# Patient Record
Sex: Female | Born: 1950 | Race: White | Hispanic: No | Marital: Married | Smoking: Never smoker
Health system: Southern US, Community
[De-identification: ages and names within clinical notes are randomized; demographics above are authoritative.]

## PROBLEM LIST (undated history)

## (undated) DIAGNOSIS — R5383 Other fatigue: Secondary | ICD-10-CM

## (undated) DIAGNOSIS — G4700 Insomnia, unspecified: Secondary | ICD-10-CM

## (undated) DIAGNOSIS — E559 Vitamin D deficiency, unspecified: Secondary | ICD-10-CM

## (undated) DIAGNOSIS — K200 Eosinophilic esophagitis: Secondary | ICD-10-CM

## (undated) DIAGNOSIS — Z9889 Other specified postprocedural states: Secondary | ICD-10-CM

## (undated) DIAGNOSIS — C801 Malignant (primary) neoplasm, unspecified: Secondary | ICD-10-CM

## (undated) DIAGNOSIS — K635 Polyp of colon: Secondary | ICD-10-CM

## (undated) DIAGNOSIS — K900 Celiac disease: Secondary | ICD-10-CM

## (undated) DIAGNOSIS — F329 Major depressive disorder, single episode, unspecified: Secondary | ICD-10-CM

## (undated) DIAGNOSIS — F32A Depression, unspecified: Secondary | ICD-10-CM

## (undated) DIAGNOSIS — E785 Hyperlipidemia, unspecified: Secondary | ICD-10-CM

## (undated) DIAGNOSIS — F419 Anxiety disorder, unspecified: Secondary | ICD-10-CM

## (undated) DIAGNOSIS — Z8719 Personal history of other diseases of the digestive system: Secondary | ICD-10-CM

## (undated) DIAGNOSIS — Z85828 Personal history of other malignant neoplasm of skin: Secondary | ICD-10-CM

## (undated) DIAGNOSIS — K589 Irritable bowel syndrome without diarrhea: Secondary | ICD-10-CM

## (undated) DIAGNOSIS — K219 Gastro-esophageal reflux disease without esophagitis: Secondary | ICD-10-CM

## (undated) DIAGNOSIS — E039 Hypothyroidism, unspecified: Secondary | ICD-10-CM

## (undated) HISTORY — DX: Depression, unspecified: F32.A

## (undated) HISTORY — DX: Vitamin D deficiency, unspecified: E55.9

## (undated) HISTORY — PX: TONSILLECTOMY: SUR1361

## (undated) HISTORY — DX: Insomnia, unspecified: G47.00

## (undated) HISTORY — DX: Other fatigue: R53.83

## (undated) HISTORY — DX: Gastro-esophageal reflux disease without esophagitis: K21.9

## (undated) HISTORY — DX: Hyperlipidemia, unspecified: E78.5

## (undated) HISTORY — PX: PARATHYROIDECTOMY: SHX19

## (undated) HISTORY — DX: Irritable bowel syndrome, unspecified: K58.9

## (undated) HISTORY — DX: Celiac disease: K90.0

## (undated) HISTORY — DX: Anxiety disorder, unspecified: F41.9

## (undated) HISTORY — DX: Eosinophilic esophagitis: K20.0

## (undated) HISTORY — DX: Polyp of colon: K63.5

## (undated) HISTORY — DX: Personal history of other malignant neoplasm of skin: Z85.828

## (undated) HISTORY — DX: Other specified postprocedural states: Z98.890

## (undated) HISTORY — DX: Major depressive disorder, single episode, unspecified: F32.9

## (undated) HISTORY — DX: Personal history of other diseases of the digestive system: Z87.19

## (undated) HISTORY — DX: Hypothyroidism, unspecified: E03.9

## (undated) HISTORY — DX: Malignant (primary) neoplasm, unspecified: C80.1

---

## 1998-08-02 ENCOUNTER — Other Ambulatory Visit: Admission: RE | Admit: 1998-08-02 | Discharge: 1998-08-02 | Payer: Self-pay | Admitting: Obstetrics and Gynecology

## 1998-10-19 ENCOUNTER — Other Ambulatory Visit: Admission: RE | Admit: 1998-10-19 | Discharge: 1998-10-19 | Payer: Self-pay | Admitting: *Deleted

## 1998-11-05 HISTORY — PX: CARPAL TUNNEL RELEASE: SHX101

## 1999-06-23 ENCOUNTER — Other Ambulatory Visit: Admission: RE | Admit: 1999-06-23 | Discharge: 1999-06-23 | Payer: Self-pay | Admitting: Obstetrics and Gynecology

## 2000-01-17 ENCOUNTER — Other Ambulatory Visit: Admission: RE | Admit: 2000-01-17 | Discharge: 2000-01-17 | Payer: Self-pay | Admitting: Obstetrics and Gynecology

## 2000-08-06 ENCOUNTER — Encounter: Admission: RE | Admit: 2000-08-06 | Discharge: 2000-08-06 | Payer: Self-pay | Admitting: *Deleted

## 2000-08-21 ENCOUNTER — Other Ambulatory Visit: Admission: RE | Admit: 2000-08-21 | Discharge: 2000-08-21 | Payer: Self-pay | Admitting: *Deleted

## 2001-06-10 ENCOUNTER — Encounter: Admission: RE | Admit: 2001-06-10 | Discharge: 2001-06-10 | Payer: Self-pay | Admitting: *Deleted

## 2001-06-26 ENCOUNTER — Other Ambulatory Visit: Admission: RE | Admit: 2001-06-26 | Discharge: 2001-06-26 | Payer: Self-pay | Admitting: *Deleted

## 2001-06-26 ENCOUNTER — Encounter: Admission: RE | Admit: 2001-06-26 | Discharge: 2001-06-26 | Payer: Self-pay | Admitting: *Deleted

## 2002-04-03 ENCOUNTER — Encounter: Payer: Self-pay | Admitting: Internal Medicine

## 2002-04-03 ENCOUNTER — Ambulatory Visit (HOSPITAL_COMMUNITY): Admission: RE | Admit: 2002-04-03 | Discharge: 2002-04-03 | Payer: Self-pay | Admitting: Internal Medicine

## 2002-06-15 ENCOUNTER — Encounter: Payer: Self-pay | Admitting: *Deleted

## 2002-06-15 ENCOUNTER — Encounter: Admission: RE | Admit: 2002-06-15 | Discharge: 2002-06-15 | Payer: Self-pay | Admitting: *Deleted

## 2002-06-15 ENCOUNTER — Other Ambulatory Visit: Admission: RE | Admit: 2002-06-15 | Discharge: 2002-06-15 | Payer: Self-pay | Admitting: *Deleted

## 2003-06-23 ENCOUNTER — Encounter: Admission: RE | Admit: 2003-06-23 | Discharge: 2003-06-23 | Payer: Self-pay | Admitting: *Deleted

## 2003-06-23 ENCOUNTER — Encounter: Payer: Self-pay | Admitting: *Deleted

## 2003-06-23 ENCOUNTER — Other Ambulatory Visit: Admission: RE | Admit: 2003-06-23 | Discharge: 2003-06-23 | Payer: Self-pay | Admitting: *Deleted

## 2003-09-10 ENCOUNTER — Ambulatory Visit (HOSPITAL_COMMUNITY): Admission: RE | Admit: 2003-09-10 | Discharge: 2003-09-10 | Payer: Self-pay | Admitting: Gastroenterology

## 2004-06-13 ENCOUNTER — Encounter: Admission: RE | Admit: 2004-06-13 | Discharge: 2004-06-13 | Payer: Self-pay | Admitting: Internal Medicine

## 2004-08-29 ENCOUNTER — Ambulatory Visit (HOSPITAL_COMMUNITY): Admission: RE | Admit: 2004-08-29 | Discharge: 2004-08-29 | Payer: Self-pay | Admitting: Orthopedic Surgery

## 2004-08-29 ENCOUNTER — Ambulatory Visit (HOSPITAL_BASED_OUTPATIENT_CLINIC_OR_DEPARTMENT_OTHER): Admission: RE | Admit: 2004-08-29 | Discharge: 2004-08-29 | Payer: Self-pay | Admitting: Orthopedic Surgery

## 2005-06-14 ENCOUNTER — Encounter: Admission: RE | Admit: 2005-06-14 | Discharge: 2005-06-14 | Payer: Self-pay | Admitting: Obstetrics and Gynecology

## 2005-07-17 ENCOUNTER — Other Ambulatory Visit: Admission: RE | Admit: 2005-07-17 | Discharge: 2005-07-17 | Payer: Self-pay | Admitting: Obstetrics and Gynecology

## 2007-08-06 HISTORY — PX: UPPER GI ENDOSCOPY: SHX6162

## 2007-09-11 ENCOUNTER — Encounter: Admission: RE | Admit: 2007-09-11 | Discharge: 2007-09-11 | Payer: Self-pay | Admitting: Internal Medicine

## 2008-09-09 ENCOUNTER — Encounter: Admission: RE | Admit: 2008-09-09 | Discharge: 2008-09-09 | Payer: Self-pay | Admitting: Internal Medicine

## 2009-10-05 ENCOUNTER — Encounter: Admission: RE | Admit: 2009-10-05 | Discharge: 2009-10-05 | Payer: Self-pay | Admitting: Internal Medicine

## 2010-10-12 ENCOUNTER — Encounter
Admission: RE | Admit: 2010-10-12 | Discharge: 2010-10-12 | Payer: Self-pay | Source: Home / Self Care | Admitting: Internal Medicine

## 2010-11-25 ENCOUNTER — Encounter: Payer: Self-pay | Admitting: Gastroenterology

## 2011-03-23 NOTE — Op Note (Signed)
NAME:  Kerry Lewis, Kerry Lewis                ACCOUNT NO.:  [id number]   MEDICAL RECORD NO.:  [id number]          PATIENT TYPE:  AMB   LOCATION:  [place]                          FACILITY:  [facility]   PHYSICIAN:  Youlanda Mighty. Sypher Brooke Bonito., M.D.DATE OF BIRTH:  11-May-1951   DATE OF PROCEDURE:  08/29/2004  DATE OF DISCHARGE:                                 OPERATIVE REPORT   PREOPERATIVE DIAGNOSIS:  Entrapment neuropathy, median nerve, right carpal  tunnel.   POSTOPERATIVE DIAGNOSIS:  Entrapment neuropathy, median nerve, right carpal  tunnel.   OPERATION:  Release of right transverse carpal ligament.   OPERATING SURGEON:  Youlanda Mighty. Sypher, M.D.   ASSISTANT:  Julian Reil, P.A.   ANESTHESIA:  General by LMA.   SUPERVISING ANESTHESIOLOGIST:  Leda Quail, M.D.   INDICATIONS:  Kerry Lewis is a 60 year old woman who was referred by Dr.  Teressa Lower in [place], [place] for evaluation and management of  hand discomfort and numbness on the right.  Clinical examination suggested  entrapment neuropathy of the median nerve at the wrist level consistent with  carpal tunnel syndrome.   Due to a failure to respond to nonoperative measures, she is brought to the  operating room at this time for release of her right transverse carpal  ligament.   PROCEDURE:  Kerry Lewis was brought to the operating room and placed in  supine position on the operating table.  Following induction of general  anesthesia by LMA technique, the right arm was prepped with Betadine soaping  solution and sterilely draped.   Following exsanguination of the right arm and hand with an Esmarch bandage,  the arterial tourniquet on the proximal brachium was inflated to 220 mmHg.  Procedure commenced with a short incision in the line of the ring finger and  palm.  Subcutaneous tissues were carefully divided, revealing the palmar  fascia.  This was split longitudinally to reveal the common extensor  branches of the median nerve.   These were followed back to the transverse  carpal ligament proper, which was gently isolated from the median nerve.  The ligament was then released with scissors subcutaneously, extending into  the distal forearm.   This widely opened the carpal canal.  No masses or other predicaments were  noted.  Bleeding points along the margin of the released ligament were  electrocauterized with bipolar current, followed by repair of the skin with  intradermal 3-0 Prolene suture.   A compressive dressing was applied with a volar plaster splint to maintain  the wrist in 5 degrees of dorsiflexion.   For aftercare, Kerry Lewis is advised to elevate her hand for 1 week.  She  will work on range of motion exercises.  She was given a prescription for  Percocet 5 mg 1 or 2 tablets p.o. q.4-6 h. p.r.n. pain, 20 tablets without  refill.      [facility]   RVS/MEDQ  D:  08/29/2004  T:  08/29/2004  Job:  [id number]   cc:   Teressa Lower  [address]  [place] [address]  Fax: [phone]

## 2012-04-14 ENCOUNTER — Other Ambulatory Visit: Payer: Self-pay | Admitting: Internal Medicine

## 2012-04-14 DIAGNOSIS — M8580 Other specified disorders of bone density and structure, unspecified site: Secondary | ICD-10-CM

## 2012-04-28 ENCOUNTER — Ambulatory Visit
Admission: RE | Admit: 2012-04-28 | Discharge: 2012-04-28 | Disposition: A | Discharge status: Home / Self Care | Payer: Self-pay | Source: Ambulatory Visit | Attending: Internal Medicine | Admitting: Internal Medicine

## 2012-04-28 DIAGNOSIS — M8580 Other specified disorders of bone density and structure, unspecified site: Secondary | ICD-10-CM

## 2013-07-02 HISTORY — PX: COLONOSCOPY: SHX174

## 2013-11-23 ENCOUNTER — Other Ambulatory Visit: Payer: Self-pay

## 2013-11-23 DIAGNOSIS — Z1231 Encounter for screening mammogram for malignant neoplasm of breast: Secondary | ICD-10-CM

## 2013-12-09 ENCOUNTER — Ambulatory Visit: Payer: Self-pay

## 2013-12-09 ENCOUNTER — Ambulatory Visit
Admission: RE | Admit: 2013-12-09 | Discharge: 2013-12-09 | Disposition: A | Discharge status: Home / Self Care | Payer: PRIVATE HEALTH INSURANCE | Source: Ambulatory Visit

## 2013-12-09 DIAGNOSIS — Z1231 Encounter for screening mammogram for malignant neoplasm of breast: Secondary | ICD-10-CM

## 2013-12-28 ENCOUNTER — Ambulatory Visit (INDEPENDENT_AMBULATORY_CARE_PROVIDER_SITE_OTHER): Payer: PRIVATE HEALTH INSURANCE | Admitting: Gynecology

## 2013-12-28 ENCOUNTER — Telehealth: Payer: Self-pay | Admitting: *Deleted

## 2013-12-28 ENCOUNTER — Other Ambulatory Visit (HOSPITAL_COMMUNITY)
Admission: RE | Admit: 2013-12-28 | Discharge: 2013-12-28 | Disposition: A | Discharge status: Home / Self Care | Payer: PRIVATE HEALTH INSURANCE | Source: Ambulatory Visit | Attending: Gynecology | Admitting: Gynecology

## 2013-12-28 ENCOUNTER — Encounter: Payer: Self-pay | Admitting: Gynecology

## 2013-12-28 VITALS — BP 130/82 | Ht 63.5 in | Wt 128.4 lb

## 2013-12-28 DIAGNOSIS — M8580 Other specified disorders of bone density and structure, unspecified site: Secondary | ICD-10-CM

## 2013-12-28 DIAGNOSIS — M899 Disorder of bone, unspecified: Secondary | ICD-10-CM

## 2013-12-28 DIAGNOSIS — N951 Menopausal and female climacteric states: Secondary | ICD-10-CM

## 2013-12-28 DIAGNOSIS — K900 Celiac disease: Secondary | ICD-10-CM | POA: Insufficient documentation

## 2013-12-28 DIAGNOSIS — Z780 Asymptomatic menopausal state: Secondary | ICD-10-CM

## 2013-12-28 DIAGNOSIS — N952 Postmenopausal atrophic vaginitis: Secondary | ICD-10-CM

## 2013-12-28 DIAGNOSIS — M949 Disorder of cartilage, unspecified: Secondary | ICD-10-CM

## 2013-12-28 DIAGNOSIS — N950 Postmenopausal bleeding: Secondary | ICD-10-CM

## 2013-12-28 DIAGNOSIS — Z01419 Encounter for gynecological examination (general) (routine) without abnormal findings: Secondary | ICD-10-CM | POA: Insufficient documentation

## 2013-12-28 DIAGNOSIS — Z1151 Encounter for screening for human papillomavirus (HPV): Secondary | ICD-10-CM | POA: Insufficient documentation

## 2013-12-28 HISTORY — DX: Other specified disorders of bone density and structure, unspecified site: M85.80

## 2013-12-28 HISTORY — DX: Postmenopausal atrophic vaginitis: N95.2

## 2013-12-28 HISTORY — DX: Celiac disease: K90.0

## 2013-12-28 MED ORDER — OSPEMIFENE 60 MG PO TABS: 60.0000 mg | ORAL_TABLET | Freq: Every day | ORAL | Status: DC

## 2013-12-28 NOTE — Telephone Encounter (Signed)
Yes, please tell her that I inadvertently picked up the sheet of paper with the instructions that I wanted her to pursue. Tell her not to continue with the estrogen patch after the end of this week. Her progesterone tablet take 100 mg once a day 1 Wednesday and 1 Friday and then discontinue it. She should stop testosterone and estrogen cream. She could start the Hedrick today.

## 2013-12-28 NOTE — Patient Instructions (Addendum)
Endometrial Biopsy Endometrial biopsy is a procedure in which a tissue sample is taken from inside the uterus. The tissue sample is then looked at under a microscope to see if the tissue is normal or abnormal. The endometrium is the lining of the uterus. This procedure helps determine where you are in your menstrual cycle and how hormone levels are affecting the lining of the uterus. This procedure may also be used to evaluate uterine bleeding or to diagnose endometrial cancer, tuberculosis, polyps, or inflammatory conditions.  LET [facility] CARE PROVIDER KNOW ABOUT:  Any allergies you have.  All medicines you are taking, including vitamins, herbs, eye drops, creams, and over-the-counter medicines.  Previous problems you or members of your family have had with the use of anesthetics.  Any blood disorders you have.  Previous surgeries you have had.  Medical conditions you have.  Possibility of pregnancy. RISKS AND COMPLICATIONS Generally, this is a safe procedure. However, as with any procedure, complications can occur. Possible complications include:  Bleeding.  Pelvic infection.  Puncture of the uterine wall with the biopsy device (rare). BEFORE THE PROCEDURE   Keep a record of your menstrual cycles as directed by your health care provider. You may need to schedule your procedure for a specific time in your cycle.  You may want to bring a sanitary pad to wear home after the procedure.  Arrange for someone to drive you home after the procedure if you will be given a medicine to help you relax (sedative). PROCEDURE   You may be given a sedative to relax you.  You will lie on an exam table with your feet and legs supported as in a pelvic exam.  Your health care provider will insert an instrument (speculum) into your vagina to see your cervix.  Your cervix will be cleansed with an antiseptic solution. A medicine (local anesthetic) will be used to numb the cervix.  A forceps  instrument (tenaculum) will be used to hold your cervix steady for the biopsy.  A thin, rodlike instrument (uterine sound) will be inserted through your cervix to determine the length of your uterus and the location where the biopsy sample will be removed.  A thin, flexible tube (catheter) will be inserted through your cervix and into the uterus. The catheter is used to collect the biopsy sample from your endometrial tissue.  The catheter and speculum will then be removed, and the tissue sample will be sent to a lab for examination. AFTER THE PROCEDURE  You will rest in a recovery area until you are ready to go home.  You may have mild cramping and a small amount of vaginal bleeding for a few days after the procedure. This is normal.  Make sure you find out how to get your test results. Document Released: 02/22/2005 Document Revised: 06/24/2013 Document Reviewed: 04/08/2013 [facility] Patient Information 2014 [place], [place]. Transvaginal Ultrasound Transvaginal ultrasound is a pelvic ultrasound, using a metal probe that is placed in the vagina, to look at a women's female organs. Transvaginal ultrasound is a method of seeing inside the pelvis of a woman. The ultrasound machine sends out sound waves from the transducer (probe). These sound waves bounce off body structures (like an echo) to create a picture. The picture shows up on a monitor. It is called transvaginal because the probe is inserted into the vagina. There should be very little discomfort from the vaginal probe. This test can also be used during pregnancy. Endovaginal ultrasound is another name for a  transvaginal ultrasound. In a transabdominal ultrasound, the probe is placed on the outside of the belly. This method gives pictures that are lower quality than pictures from the transvaginal technique. Transvaginal ultrasound is used to look for problems of the female genital tract. Some such problems include:  Infertility  problems.  Congenital (birth defect) malformations of the uterus and ovaries.  Tumors in the uterus.  Abnormal bleeding.  Ovarian tumors and cysts.  Abscess (inflamed tissue around pus) in the pelvis.  Unexplained abdominal or pelvic pain.  Pelvic infection. DURING PREGNANCY, TRANSVAGINAL ULTRASOUND MAY BE USED TO LOOK AT:  Normal pregnancy.  Ectopic pregnancy (pregnancy outside the uterus).  Fetal heartbeat.  Abnormalities in the pelvis, that are not seen well with transabdominal ultrasound.  Suspected twins or multiples.  Impending miscarriage.  Problems with the cervix (incompetent cervix, not able to stay closed and hold the baby).  When doing an amniocentesis (removing fluid from the pregnancy sac, for testing).  Looking for abnormalities of the baby.  Checking the growth, development, and age of the fetus.  Measuring the amount of fluid in the amniotic sac.  When doing an external version of the baby (moving baby into correct position).  Evaluating the baby for problems in high risk pregnancies (biophysical profile).  Suspected fetal demise (death). Sometimes a special ultrasound method called Saline Infusion Sonography (SIS) is used for a more accurate look at the uterus. Sterile saline (salt water) is injected into the uterus of non-pregnant patients to see the inside of the uterus better. SIS is not used on pregnant women. The vaginal probe can also assist in obtaining biopsies of abnormal areas, in draining fluid from cysts on the ovary, and in finding IUDs (intrauterine device, birth control) that cannot be located. PREPARATION FOR TEST A transvaginal ultrasound is done with the bladder empty. The transabdominal ultrasound is done with your bladder full. You may be asked to drink several glasses of water before that exam. Sometimes, a transabdominal ultrasound is done just after a transvaginal ultrasound, to look at organs in your abdomen. PROCEDURE  You  will lie down on a table, with your knees bent and your feet in foot holders. The probe is covered with a condom. A sterile lubricant is put into the vagina and on the probe. The lubricant helps transmit the sound waves and avoid irritating the vagina. Your caregiver will move the probe inside the vaginal cavity to scan the pelvic structures. A normal test will show a normal pelvis and normal contents. An abnormal test will show abnormalities of the pelvis, placenta, or baby. ABNORMAL RESULTS MAY BE DUE TO:  Growths or tumors in the:  Uterus.  Ovaries.  Vagina.  Other pelvic structures.  Non-cancerous growths of the uterus and ovaries.  Twisting of the ovary, cutting off blood supply to the ovary (ovarian torsion).  Areas of infection, including:  Pelvic inflammatory disease.  Abscess in the pelvis.  Locating an IUD. PROBLEMS FOUND IN PREGNANT WOMEN MAY INCLUDE:  Ectopic pregnancy (pregnancy outside the uterus).  Multiple pregnancies.  Early dilation (opening) of the cervix. This may indicate an incompetent cervix and early delivery.  Impending miscarriage.  Fetal death.  Problems with the placenta, including:  Placenta has grown over the opening of the womb (placenta previa).  Placenta has separated early in the womb (placental abruption).  Placenta grows into the muscle of the uterus (placenta accreta).  Tumors of pregnancy, including gestational trophoblastic disease. This is an abnormal pregnancy, with no fetus. The  uterus is filled with many grape-like cysts that could sometimes be cancerous.  Incorrect position of the fetus (breech, vertex).  Intrauterine fetal growth retardation (IUGR) (poor growth in the womb).  Fetal abnormalities or infection. RISKS AND COMPLICATIONS There are no known risks to the ultrasound procedure. There is no X-ray used when doing an ultrasound. Document Released: 10/03/2004 Document Revised: 01/14/2012 Document Reviewed:  09/21/2009 [facility] Patient Information 2014 [place], [place]. Ospemifene oral tablets What is this medicine? OSPEMIFENE (os PEM i feen) is used to treat painful sexual intercourse in females, a symptom of changes in and around the vagina during menopause. This medicine may be used for other purposes; ask your health care provider or pharmacist if you have questions. COMMON BRAND NAME(S): Osphena What should I tell my health care provider before I take this medicine? They need to know if you have any of these conditions: -cancer, such as breast, uterine, or other cancer -heart disease -history of blood clots -history of stroke -history of vaginal bleeding -liver disease -premenopausal -smoke tobacco -an unusual or allergic reaction to ospemifene, other medicines, foods, dyes, or preservatives -pregnant or trying to get pregnant -breast-feeding How should I use this medicine? Take this medicine by mouth with a glass of water. Take this medicine with food. Follow the directions on the prescription label. Do not take your medicine more often than directed. Talk to your pediatrician regarding the use of this medicine in children. Special care may be needed. Overdosage: If you think you've taken too much of this medicine contact a poison control center or emergency room at once. Overdosage: If you think you have taken too much of this medicine contact a poison control center or emergency room at once. NOTE: This medicine is only for you. Do not share this medicine with others. What if I miss a dose? If you miss a dose, take it as soon as you can. If it is almost time for your next dose, take only that dose. Do not take double or extra doses. What may interact with this medicine? -doxycycline -estrogens -fluconazole -furosemide -glyburide -ketoconazole -phenytoin -rifampin -warfarin This list may not describe all possible interactions. Give your health care provider a list of all the  medicines, herbs, non-prescription drugs, or dietary supplements you use. Also tell them if you smoke, drink alcohol, or use illegal drugs. Some items may interact with your medicine. What should I watch for while using this medicine? Visit your health care professional for regular checks on your progress. You will need a regular breast and pelvic exam and Pap smear while on this medicine. You should also discuss the need for regular mammograms with your health care professional, and follow his or her guidelines for these tests. Also, periodically discuss the need to continue taking this medicine. Taking this medicine for long periods of time may increase your risk for serious side effects. This medicine can increase the risk of developing a condition (endometrial hyperplasia) that may lead to cancer of the lining of the uterus. Taking progestins, another hormone drug, with this medicine lowers the risk of developing this condition. Therefore, if your uterus has not been removed (by a hysterectomy), your doctor may prescribe a progestin for you to take together with your estrogen. You should know, however, that taking estrogens with progestins may have additional health risks. You should discuss the use of estrogens and progestins with your health care professional to determine the benefits and risks for you. This medicine can rarely cause blood clots.  You should avoid long periods of bed rest while taking this medicine. If you are going to have surgery, tell your doctor or health care professional that you are taking this medicine. This medicine should be stopped at least 4-6 weeks before surgery. After surgery, it should be restarted only after you are walking again. It should not be restarted while you still need long periods of bed rest. You should not smoke while taking this medicine. Smoking may also increase your risk of blood clots. Smoking can also decrease the effects of this medicine. This medicine  does not prevent hot flashes. It may cause hot flashes in some patients. If you have any reason to think you are pregnant; stop taking this medicine at once and contact your doctor or health care professional. What side effects may I notice from receiving this medicine? Side effects that you should report to your doctor or health care professional as soon as possible: -breathing problems -changes in vision -confusion, trouble speaking or understanding -new breast lumps -pain, swelling, warmth in the leg -pelvic pain or pressure -severe headaches -sudden chest pain -sudden numbness or weakness of the face, arm or leg -trouble walking, dizziness, loss of balance or coordination -unusual vaginal bleeding patterns -vaginal discharge that is bloody or brown  Side effects that usually do not require medical attention (Report these to your doctor or health care professional if they continue or are bothersome.): -hot flushes or flashes -increased sweating -muscle cramps -vaginal discharge (white or clear) This list may not describe all possible side effects. Call your doctor for medical advice about side effects. You may report side effects to FDA at 1-800-FDA-1088. Where should I keep my medicine? Keep out of the reach of children. Store at room temperature between 20 and 25 degrees C (68 and 77 degrees F). Protect from light. Keep container tightly closed. Throw away any unused medicine after the expiration date. NOTE: This sheet is a summary. It may not cover all possible information. If you have questions about this medicine, talk to your doctor, pharmacist, or health care provider.  2014, [employer]vier/[employer] Standard. (2012-01-03 10:29:05)

## 2013-12-28 NOTE — Telephone Encounter (Signed)
Pt was seen today with direction on how to wean off HRT, but pt never received the written directions on how to do this. Per note patient is taking:  Estrogen patch 0.375 q. weekly  Progesterone 300 mg daily  Testosterone Propionate cream which she applied daily  Estrace vaginal cream each bedtime  Please advise how to wean off.

## 2013-12-28 NOTE — Progress Notes (Signed)
Kerry Lewis 1951-09-13 [id number]   History:    63 y.o.  for annual gyn exam patient to the practice who is being followed by her family doctor in [place], [place] A. Dr. Delena Bali whose been doing her blood work. She has not had a Pap smear in 3 years. Patient states that she has normal Pap smears in the past. Patient suffers from celiac disease and has had history of vitamin D deficiency and is on 5000 units daily. Her PCP recently did all her lab work to include vitamin D level which was normal. Patient had gone to a holistic medical group practice who had started her on estrogen progesterone combination as a result of patient's vaginal atrophy. Patient states that she has not been able to have intercourse for 10 years. She's currently on the following regimen:  Estrogen patch 0.375 q. weekly Progesterone 300 mg daily Testosterone Propionate  cream which she applied daily Estrace vaginal cream each bedtime  Patient stated that in January of this year she had bleeding for one week and had not had any bleeding since her menopause 10 years ago.  Patient had a colonoscopy 2014 which was normal. Her mammogram was done this year reported to be normal. Last bone density study done in 2013 with the lowest T score of -1.8 left femoral neck. Patient had been on reclast  for 2 years only. Patient scheduled for bone density study Later this year.    Past medical history,surgical history, family history and social history were all reviewed and documented in the EPIC chart.  Gynecologic History No LMP recorded. Patient is postmenopausal. Contraception: post menopausal status Last Pap: 3 years ago. Results were: normal Last mammogram: 2015. Results were: normal  Obstetric History OB History  Gravida Para Term Preterm AB SAB TAB Ectopic Multiple Living  3 2   1 1    2     # Outcome Date GA Lbr Len/2nd Weight Sex Delivery Anes PTL Lv  3 SAB           2 PAR           1 PAR                 ROS: A ROS was performed and pertinent positives and negatives are included in the history.  GENERAL: No fevers or chills. HEENT: No change in vision, no earache, sore throat or sinus congestion. NECK: No pain or stiffness. CARDIOVASCULAR: No chest pain or pressure. No palpitations. PULMONARY: No shortness of breath, cough or wheeze. GASTROINTESTINAL: No abdominal pain, nausea, vomiting or diarrhea, melena or bright red blood per rectum. GENITOURINARY: No urinary frequency, urgency, hesitancy or dysuria. MUSCULOSKELETAL: No joint or muscle pain, no back pain, no recent trauma. DERMATOLOGIC: No rash, no itching, no lesions. ENDOCRINE: No polyuria, polydipsia, no heat or cold intolerance. No recent change in weight. HEMATOLOGICAL: No anemia or easy bruising or bleeding. NEUROLOGIC: No headache, seizures, numbness, tingling or weakness. PSYCHIATRIC: No depression, no loss of interest in normal activity or change in sleep pattern.     Exam: chaperone present  BP 130/82  Ht 5' 3.5" (1.613 m)  Wt 128 lb 6.4 oz (58.242 kg)  BMI 22.39 kg/m2  Body mass index is 22.39 kg/(m^2).  General appearance : Well developed well nourished female. No acute distress HEENT: Neck supple, trachea midline, no carotid bruits, no thyroidmegaly Lungs: Clear to auscultation, no rhonchi or wheezes, or rib retractions  Heart: Regular rate and rhythm, no murmurs  or gallops Breast:Examined in sitting and supine position were symmetrical in appearance, no palpable masses or tenderness,  no skin retraction, no nipple inversion, no nipple discharge, no skin discoloration, no axillary or supraclavicular lymphadenopathy Abdomen: no palpable masses or tenderness, no rebound or guarding Extremities: no edema or skin discoloration or tenderness  Pelvic:  Bartholin, Urethra, Skene Glands: Within normal limits             Vagina: No gross lesions or discharge  Cervix: No gross lesions or discharge  Uterus  anteverted, normal  size, shape and consistency, non-tender and mobile  Adnexa  Without masses or tenderness  Anus and perineum  normal   Rectovaginal  normal sphincter tone without palpated masses or tenderness             Hemoccult cards provided     Assessment/Plan:  63 y.o. female for annual exam with postmenopausal bleeding. I've raising the concerns of the amount of estrogen and daily progesterone and topical testosterone which could put her at increased risk for breast cancer. Her main issue has been vaginal atrophy and dyspareunia. We discussed alternative treatment with Osphena 60 mg daily by mouth and to discontinue all her entire regimen described above. The risks benefits and pros and cons of the medication were discussed. Because of her postmenopausal bleeding and endometrial biopsy was done in a sterile fashion with a Pipelle with minimal tissue obtained and submitted for histological evaluation. Patient will return back to the office next week for a sonohysterogram to rule out any intracavitary defect. Her Pap smear was done today and the new guidelines were discussed. Hemoccult cards were provided to the patient. Patient states her immunizations are up-to-date. We will await the results of the bone density study from this year.  Note: This dictation was prepared with  Dragon/digital dictation along withSmart phrase technology. Any transcriptional errors that result from this process are unintentional.   Terrance Mass MD, 1:49 PM 12/28/2013

## 2013-12-29 ENCOUNTER — Telehealth: Payer: Self-pay

## 2013-12-29 NOTE — Telephone Encounter (Signed)
Patient said the female PCP physician you recommended to her at [facility] is booked out until Sept. She wondered if there was anyone else you could recommend?

## 2013-12-29 NOTE — Telephone Encounter (Signed)
Rachell Cipro MD   [phone]

## 2013-12-29 NOTE — Telephone Encounter (Signed)
Pt informed with the below note. 

## 2013-12-30 ENCOUNTER — Other Ambulatory Visit: Payer: Self-pay | Admitting: Gynecology

## 2013-12-30 DIAGNOSIS — N950 Postmenopausal bleeding: Secondary | ICD-10-CM

## 2014-01-01 ENCOUNTER — Telehealth: Payer: Self-pay | Admitting: *Deleted

## 2014-01-01 NOTE — Telephone Encounter (Signed)
Pt was seen on 12/28/13 has D/C HRT and started on Osphena 60 mg daily by mouth, pt is now spotting she asked me to rely to you. Please advise

## 2014-01-01 NOTE — Telephone Encounter (Signed)
Please explaine to her that this is probably as a result of her withdrawal from her hormones. I would like to see her in 3 months for a followup as well as a sonohysterogram.   Have her maintain a calendar for the next 3 months so that she can bring it with her at time of her sonohysterogram. If she does continue to bleed we will then also do a biopsy at that time.

## 2014-01-01 NOTE — Telephone Encounter (Signed)
After the sonohysterogram. Thanks

## 2014-01-01 NOTE — Telephone Encounter (Signed)
Patient informed. She will keep appt next week.

## 2014-01-01 NOTE — Telephone Encounter (Signed)
Patient informed. 

## 2014-01-01 NOTE — Telephone Encounter (Signed)
Per your office note on 12/28/13 patient was seen with PMB and she is scheduled for a SHGM for next week.   Per your note below you said to keep calendar and return in 3 months for [facility].    Just clarifying.

## 2014-01-06 ENCOUNTER — Other Ambulatory Visit: Payer: Self-pay | Admitting: Gynecology

## 2014-01-06 ENCOUNTER — Ambulatory Visit (INDEPENDENT_AMBULATORY_CARE_PROVIDER_SITE_OTHER): Payer: PRIVATE HEALTH INSURANCE | Admitting: Gynecology

## 2014-01-06 ENCOUNTER — Ambulatory Visit (INDEPENDENT_AMBULATORY_CARE_PROVIDER_SITE_OTHER): Payer: PRIVATE HEALTH INSURANCE

## 2014-01-06 DIAGNOSIS — D251 Intramural leiomyoma of uterus: Secondary | ICD-10-CM

## 2014-01-06 DIAGNOSIS — M949 Disorder of cartilage, unspecified: Secondary | ICD-10-CM

## 2014-01-06 DIAGNOSIS — D259 Leiomyoma of uterus, unspecified: Secondary | ICD-10-CM

## 2014-01-06 DIAGNOSIS — N950 Postmenopausal bleeding: Secondary | ICD-10-CM

## 2014-01-06 DIAGNOSIS — D252 Subserosal leiomyoma of uterus: Secondary | ICD-10-CM

## 2014-01-06 DIAGNOSIS — N952 Postmenopausal atrophic vaginitis: Secondary | ICD-10-CM

## 2014-01-06 DIAGNOSIS — N83339 Acquired atrophy of ovary and fallopian tube, unspecified side: Secondary | ICD-10-CM

## 2014-01-06 DIAGNOSIS — M899 Disorder of bone, unspecified: Secondary | ICD-10-CM

## 2014-01-06 DIAGNOSIS — M8580 Other specified disorders of bone density and structure, unspecified site: Secondary | ICD-10-CM

## 2014-01-06 DIAGNOSIS — IMO0002 Reserved for concepts with insufficient information to code with codable children: Secondary | ICD-10-CM

## 2014-01-07 NOTE — Progress Notes (Signed)
   The patient is a 63 year old who was seen in the office as a new patient in 12/28/2013.Patient suffers from celiac disease and has had history of vitamin D deficiency and is on 5000 units daily. Her PCP recently did all her lab work to include vitamin D level which was normal. Patient had gone to a holistic medical group practice who had started her on estrogen progesterone combination as a result of patient's vaginal atrophy. Patient states that she has not been able to have intercourse for 10 years. She's currently on the following regimen:   Estrogen patch 0.375 q. weekly  Progesterone 300 mg daily  Testosterone Propionate cream which she applied daily  Estrace vaginal cream each bedtime  Patient complained since January of this year she had been bleeding one week had not bled in over 10 years. I had raised concerns over theamount of estrogen and daily progesterone and topical testosterone which could put her at increased risk for breast cancer. Her main issue has been vaginal atrophy and dyspareunia. We discussed alternative treatment with Osphena 60 mg daily by mouth and to discontinue all her entire regimen described above. The risks benefits and pros and cons of the medication were discussed.  We did an endometrial biopsy at that office visit with the following results:  Diagnosis Endometrium, biopsy, uterus - BENIGN INACTIVE ENDOMETRIUM. - NO ATYPIA, HYPERPLASIA OR MALIGNANCY IDENTIFIED.  She presented today for a sonohysterogram to complete evaluation. Ultrasound/sonohysterogram today as follows:  Uterus measured 8.6 x 5.2 x 5.0 cm with endometrial stripe of 7.2 mm. A small uterine fibroid measuring 22 x 19 mm and a second one measuring 25 x 26 mm subserosal was noted. Prominent endometrial cavity. Right left ovary were normal. Excess of bowel activity was noted. No fluid in the cul-de-sac. The cervix was cleansed with Betadine solution and a sterile catheter was introduced into the  uterine cavity followed by instilling normal saline. No uterine cavity abnormality was noted.  Assessment/plan: Postmenopausal bleeding probably attributed to the estrogen/testosterone/progesterone regimen that she had recently been placed on by her holistic physician. She has since discontinued it and started on Osphena 60 mg 1 by mouth daily for her vaginal atrophy and dyspareunia. She will keep a calendar or the next few months her return to see me in 3 months for followup.  Patient had a history of osteopenia and had been on the following regimen in the past: She took [place] for 6 years She then went on Reclast for 3 years She has been on a drug holiday since then. Her last bone density study in 2013 demonstrated the lowest T score was at the left femoral neck with a value -1.8 with report oftesticle change in her bone mineralization when compared with study in 2010. She will continue with her calcium vitamin D daily and regular exercise for osteoporosis prevention. She is due in June for her next bone density study. Will touch base then.

## 2014-01-21 ENCOUNTER — Other Ambulatory Visit: Payer: PRIVATE HEALTH INSURANCE | Admitting: Anesthesiology

## 2014-01-21 DIAGNOSIS — Z1211 Encounter for screening for malignant neoplasm of colon: Secondary | ICD-10-CM

## 2014-02-25 ENCOUNTER — Telehealth: Payer: Self-pay | Admitting: *Deleted

## 2014-02-25 NOTE — Telephone Encounter (Signed)
She is to make an appointment to discuss different treatment options. If she wants to go back on estrogen she will need to discontinue the [facility]

## 2014-02-25 NOTE — Telephone Encounter (Signed)
Pt has been off HRT since Feb 2015, pt said she has noticed a large amount of hair loss since being off HRT. She asked if you would be willing to start her back on low dose of HRT ? I did recommend pt see a dermatologist. Please advise

## 2014-02-25 NOTE — Telephone Encounter (Signed)
Left message for pt to call.

## 2014-02-25 NOTE — Telephone Encounter (Signed)
Pt informed with the below note. 

## 2014-03-01 ENCOUNTER — Encounter: Payer: Self-pay | Admitting: Gynecology

## 2014-03-01 ENCOUNTER — Ambulatory Visit (INDEPENDENT_AMBULATORY_CARE_PROVIDER_SITE_OTHER): Payer: PRIVATE HEALTH INSURANCE | Admitting: Gynecology

## 2014-03-01 VITALS — BP 136/78

## 2014-03-01 DIAGNOSIS — L659 Nonscarring hair loss, unspecified: Secondary | ICD-10-CM

## 2014-03-01 DIAGNOSIS — IMO0002 Reserved for concepts with insufficient information to code with codable children: Secondary | ICD-10-CM

## 2014-03-01 DIAGNOSIS — N952 Postmenopausal atrophic vaginitis: Secondary | ICD-10-CM

## 2014-03-01 MED ORDER — NONFORMULARY OR COMPOUNDED ITEM: Status: DC

## 2014-03-01 NOTE — Progress Notes (Signed)
   Patient is a 63 year old was seen in the office on 01/07/2014 for followup.Patient had gone to a holistic medical group practice who had started her on estrogen progesterone combination as a result of patient's vaginal atrophy. Patient states that she has not been able to have intercourse for 10 years. She's currently on the following regimen:   Estrogen patch 0.375 q. weekly  Progesterone 300 mg daily  Testosterone Propionate cream which she applied daily  Estrace vaginal cream each bedtime  Patient complained since January of this year she had been bleeding one week had not bled in over 10 years. I had raised concerns over theamount of estrogen and daily progesterone and topical testosterone which could put her at increased risk for breast cancer. Her main issue has been vaginal atrophy and dyspareunia. We discussed alternative treatment with Osphena 60 mg daily by mouth and to discontinue all her entire regimen described above. The risks benefits and pros and cons of the medication were discussed.  She discontinued the Osphena because she noted no difference in her vaginal dryness and irritation.  On the office visit of February 2015 her endometrial biopsy demonstrated the following: Diagnosis  Endometrium, biopsy, uterus  - BENIGN INACTIVE ENDOMETRIUM.  - NO ATYPIA, HYPERPLASIA OR MALIGNANCY IDENTIFIED  A sonohysterogram before the endometrial biopsy had demonstrated the following: Uterus measured 8.6 x 5.2 x 5.0 cm with endometrial stripe of 7.2 mm. A small uterine fibroid measuring 22 x 19 mm and a second one measuring 25 x 26 mm subserosal was noted.  Patient denies any vaginal bleeding. She is not having any vasomotor symptoms. She stopped medications provided by her Holistic Medicine provider. Her main concern was hair loss and vaginal atrophy.  She is scheduled by her primary -- [place] to have her thyroid function tests, estrogen level progesterone testosterone level and she will  send those results. She is being treated for hypothyroidism. Meanwhile I will prescribe her vaginal estrogen to apply twice a week. If she continues to lose hair she will be referred to the dermatologist. I have recommended that she followup with an internist instead of the holistic provider. We will followup with a sonohysterogram for endometrial thickness within a year or unless she has vaginal bleeding.

## 2014-03-23 ENCOUNTER — Telehealth: Payer: Self-pay | Admitting: *Deleted

## 2014-03-23 NOTE — Telephone Encounter (Signed)
Pt asked if her labs results for April 2015 were faxed here, I told pt no. She will have labs re-faxed.

## 2014-04-08 ENCOUNTER — Telehealth: Payer: Self-pay | Admitting: *Deleted

## 2014-04-08 DIAGNOSIS — M8580 Other specified disorders of bone density and structure, unspecified site: Secondary | ICD-10-CM

## 2014-04-08 NOTE — Telephone Encounter (Signed)
Pt calling to follow up from [place] on 03/01/14 regarding hair loss. She had some blood work done which is scanned in the system, pt said that you would look at them and get back with her regarding this? Please advise

## 2014-04-09 NOTE — Telephone Encounter (Signed)
Pt informed, order placed for her to have bone density at breast center.

## 2014-04-09 NOTE — Telephone Encounter (Signed)
Please informed patient that I reviewed her lab results. She is menopausal which would explain her low estrogen and progesterone. Function tests are normal her adrenal function tests were normal testosterone was range. She should follow up with a dermatologist for hair loss.

## 2014-04-22 ENCOUNTER — Ambulatory Visit
Admission: RE | Admit: 2014-04-22 | Discharge: 2014-04-22 | Disposition: A | Discharge status: Home / Self Care | Payer: PRIVATE HEALTH INSURANCE | Source: Ambulatory Visit | Attending: Gynecology | Admitting: Gynecology

## 2014-04-22 DIAGNOSIS — M8580 Other specified disorders of bone density and structure, unspecified site: Secondary | ICD-10-CM

## 2014-05-19 ENCOUNTER — Ambulatory Visit (INDEPENDENT_AMBULATORY_CARE_PROVIDER_SITE_OTHER): Payer: PRIVATE HEALTH INSURANCE | Admitting: Gynecology

## 2014-05-19 ENCOUNTER — Encounter: Payer: Self-pay | Admitting: Gynecology

## 2014-05-19 VITALS — BP 132/70

## 2014-05-19 DIAGNOSIS — M8580 Other specified disorders of bone density and structure, unspecified site: Secondary | ICD-10-CM

## 2014-05-19 DIAGNOSIS — M949 Disorder of cartilage, unspecified: Secondary | ICD-10-CM

## 2014-05-19 DIAGNOSIS — N952 Postmenopausal atrophic vaginitis: Secondary | ICD-10-CM

## 2014-05-19 DIAGNOSIS — N951 Menopausal and female climacteric states: Secondary | ICD-10-CM

## 2014-05-19 DIAGNOSIS — M899 Disorder of bone, unspecified: Secondary | ICD-10-CM

## 2014-05-19 DIAGNOSIS — K900 Celiac disease: Secondary | ICD-10-CM

## 2014-05-19 NOTE — Progress Notes (Signed)
   Patient presented to the office today to discuss her bone density result. Patient had it done at the radiology facility. Patient has had history vitamin D deficiency and celiac sprue disease  in the past and is currently on 5000 units daily. Recently review her labs from the month of April which were in the normal range. Patient with past history of having been placed on Reclast IV by her PCP which she was on every other year for 4 years and then was switched of the knee but which she took for one year. She is currently on a drug quality. She is postmenopausal, thin, and Caucasian. Her bone density study done 04/22/2014 demonstrated the following:  The lowest T. score was at the left femoral neck with a value -2.1  COMPARISON: Comparison is made to 04/28/2012; since the prior exam  there has been a significant 4.8% increase in bone mineral density  of the lumbar spine and no significant interval change in bone  mineral density of the left hip.  No FRAX analysis to the fact that she was on the need up until last year.  Assessment/plan: Low bone mass by WHO criteria (osteopenia) on drug holiday. Patient to continue her calcium vitamin D and weightbearing exercises. No further intervention needed at this time. We'll repeat her bone density study in 2 years. She is doing well with her vaginal estrogen for her vaginal atrophy.

## 2014-09-06 ENCOUNTER — Encounter: Payer: Self-pay | Admitting: Gynecology

## 2014-12-06 ENCOUNTER — Other Ambulatory Visit: Payer: Self-pay

## 2014-12-06 DIAGNOSIS — Z1231 Encounter for screening mammogram for malignant neoplasm of breast: Secondary | ICD-10-CM

## 2014-12-13 ENCOUNTER — Ambulatory Visit: Payer: PRIVATE HEALTH INSURANCE

## 2014-12-16 ENCOUNTER — Ambulatory Visit
Admission: RE | Admit: 2014-12-16 | Discharge: 2014-12-16 | Disposition: A | Discharge status: Home / Self Care | Payer: Medicare HMO | Source: Ambulatory Visit

## 2014-12-16 DIAGNOSIS — Z1231 Encounter for screening mammogram for malignant neoplasm of breast: Secondary | ICD-10-CM

## 2014-12-20 ENCOUNTER — Ambulatory Visit: Payer: PRIVATE HEALTH INSURANCE

## 2014-12-30 ENCOUNTER — Ambulatory Visit (INDEPENDENT_AMBULATORY_CARE_PROVIDER_SITE_OTHER): Payer: Medicare HMO | Admitting: Gynecology

## 2014-12-30 ENCOUNTER — Other Ambulatory Visit (HOSPITAL_COMMUNITY)
Admission: RE | Admit: 2014-12-30 | Discharge: 2014-12-30 | Disposition: A | Discharge status: Home / Self Care | Payer: Medicare HMO | Source: Ambulatory Visit | Attending: Gynecology | Admitting: Gynecology

## 2014-12-30 ENCOUNTER — Encounter: Payer: Self-pay | Admitting: Gynecology

## 2014-12-30 VITALS — BP 130/78 | Ht 64.0 in | Wt 134.0 lb

## 2014-12-30 DIAGNOSIS — Z79890 Hormone replacement therapy (postmenopausal): Secondary | ICD-10-CM

## 2014-12-30 DIAGNOSIS — IMO0002 Reserved for concepts with insufficient information to code with codable children: Secondary | ICD-10-CM

## 2014-12-30 DIAGNOSIS — Z1151 Encounter for screening for human papillomavirus (HPV): Secondary | ICD-10-CM | POA: Diagnosis present

## 2014-12-30 DIAGNOSIS — Z124 Encounter for screening for malignant neoplasm of cervix: Secondary | ICD-10-CM | POA: Diagnosis present

## 2014-12-30 DIAGNOSIS — R896 Abnormal cytological findings in specimens from other organs, systems and tissues: Secondary | ICD-10-CM

## 2014-12-30 DIAGNOSIS — Z01419 Encounter for gynecological examination (general) (routine) without abnormal findings: Secondary | ICD-10-CM

## 2014-12-30 DIAGNOSIS — N952 Postmenopausal atrophic vaginitis: Secondary | ICD-10-CM

## 2014-12-30 DIAGNOSIS — M8580 Other specified disorders of bone density and structure, unspecified site: Secondary | ICD-10-CM

## 2014-12-30 MED ORDER — NONFORMULARY OR COMPOUNDED ITEM: Status: DC

## 2014-12-30 NOTE — Addendum Note (Signed)
Addended by: Thurnell Garbe A on: 12/30/2014 12:42 PM   Modules accepted: Orders, SmartSet

## 2014-12-30 NOTE — Progress Notes (Signed)
Kerry Lewis 1951/09/13 [id number]   History:    64 y.o.  for annual gyn exam with no complaints today. She has been followed by her family doctor in [place], [place] A. Dr. Delena Bali whose been doing her blood work. Last year here in our office her Pap smear demonstrated ASCUS without HPV. She is on vaginal estrogen twice a week which is help with her vaginal atrophy.Patient has had history vitamin D deficiency and celiac sprue disease in the past and is currently on 1000 units daily. Recently review her labs from the month of April which were in the normal range. Patient with past history of having been placed on Reclast IV by her PCP which she was on every other year for 4 years and then was switched of to [place] which she took for one year only. She is currently on a drug holiday. Her last bone density study in 04/22/2014 demonstrated the following:  The lowest T. score was at the left femoral neck with a value -2.1  COMPARISON: Comparison is made to 04/28/2012; since the prior exam  there has been a significant 4.8% increase in bone mineral density  of the lumbar spine and no significant interval change in bone  mineral density of the left hip.  Patient had benign colon polyps removed 10 years ago. Last colonoscopy in 2015 was normal. She has history of celiac sprue and IBS. Her PCP and after neurologist have been doing her blood work.  Patient with past history of basal cell carcinoma the face is being followed by her dermatologist on a regular basis.  No FRAX analysis to the fact that she was on the need up until last year.  Past medical history,surgical history, family history and social history were all reviewed and documented in the EPIC chart.  Gynecologic History No LMP recorded. Patient is postmenopausal. Contraception: post menopausal status Last Pap: 2015. Results were: See above Last mammogram: 2016. Results were: normal  Obstetric History OB History    Gravida Para Term Preterm AB SAB TAB Ectopic Multiple Living  3 2   1 1    2     # Outcome Date GA Lbr Len/2nd Weight Sex Delivery Anes PTL Lv  3 SAB           2 Para           1 Para                ROS: A ROS was performed and pertinent positives and negatives are included in the history.  GENERAL: No fevers or chills. HEENT: No change in vision, no earache, sore throat or sinus congestion. NECK: No pain or stiffness. CARDIOVASCULAR: No chest pain or pressure. No palpitations. PULMONARY: No shortness of breath, cough or wheeze. GASTROINTESTINAL: No abdominal pain, nausea, vomiting or diarrhea, melena or bright red blood per rectum. GENITOURINARY: No urinary frequency, urgency, hesitancy or dysuria. MUSCULOSKELETAL: No joint or muscle pain, no back pain, no recent trauma. DERMATOLOGIC: No rash, no itching, no lesions. ENDOCRINE: No polyuria, polydipsia, no heat or cold intolerance. No recent change in weight. HEMATOLOGICAL: No anemia or easy bruising or bleeding. NEUROLOGIC: No headache, seizures, numbness, tingling or weakness. PSYCHIATRIC: No depression, no loss of interest in normal activity or change in sleep pattern.     Exam: chaperone present  BP 130/78 mmHg  Ht 5' 4"  (1.626 m)  Wt 134 lb (60.782 kg)  BMI 22.99 kg/m2  Body mass index is 22.99 kg/(m^2).  General appearance : Well developed well nourished female. No acute distress HEENT: Eyes: no retinal hemorrhage or exudates,  Neck supple, trachea midline, no carotid bruits, no thyroidmegaly Lungs: Clear to auscultation, no rhonchi or wheezes, or rib retractions  Heart: Regular rate and rhythm, no murmurs or gallops Breast:Examined in sitting and supine position were symmetrical in appearance, no palpable masses or tenderness,  no skin retraction, no nipple inversion, no nipple discharge, no skin discoloration, no axillary or supraclavicular lymphadenopathy Abdomen: no palpable masses or tenderness, no rebound or  guarding Extremities: no edema or skin discoloration or tenderness  Pelvic:  Bartholin, Urethra, Skene Glands: Within normal limits             Vagina: No gross lesions or discharge, atrophic changes  Cervix: No gross lesions or discharge  Uterus  axial, normal size, shape and consistency, non-tender and mobile  Adnexa  Without masses or tenderness  Anus and perineum  normal   Rectovaginal  normal sphincter tone without palpated masses or tenderness             Hemoccult colonoscopy 2 months ago normal     Assessment/Plan:  64 y.o. female for annual exam postmenopausal doing well on vaginal estrogen twice a week. She is taking her calcium and vitamin D regularly and exercising regularly as well for osteoporosis prevention. Patient was stable osteopenia. Pap smear with HPV screening done today. Pap smear 12 months ago demonstrated ASCUS without HPV. PCP will be doing her blood work. We discussed importance of monthly self breast examination. Patient's flu vaccines are all up-to-date.   Terrance Mass MD, 12:08 PM 12/30/2014

## 2015-01-03 LAB — CYTOLOGY - PAP

## 2015-01-17 ENCOUNTER — Telehealth: Payer: Self-pay | Admitting: *Deleted

## 2015-01-17 NOTE — Telephone Encounter (Signed)
Pt informed with normal pap smear results 12/30/14.

## 2015-11-10 DIAGNOSIS — D1801 Hemangioma of skin and subcutaneous tissue: Secondary | ICD-10-CM | POA: Diagnosis not present

## 2015-11-10 DIAGNOSIS — L812 Freckles: Secondary | ICD-10-CM | POA: Diagnosis not present

## 2015-11-10 DIAGNOSIS — Z85828 Personal history of other malignant neoplasm of skin: Secondary | ICD-10-CM | POA: Diagnosis not present

## 2015-11-10 DIAGNOSIS — L649 Androgenic alopecia, unspecified: Secondary | ICD-10-CM | POA: Diagnosis not present

## 2015-11-10 DIAGNOSIS — L821 Other seborrheic keratosis: Secondary | ICD-10-CM | POA: Diagnosis not present

## 2015-11-28 ENCOUNTER — Other Ambulatory Visit: Payer: Self-pay

## 2015-11-28 DIAGNOSIS — Z1231 Encounter for screening mammogram for malignant neoplasm of breast: Secondary | ICD-10-CM

## 2015-12-02 DIAGNOSIS — Z6823 Body mass index (BMI) 23.0-23.9, adult: Secondary | ICD-10-CM | POA: Diagnosis not present

## 2015-12-02 DIAGNOSIS — Z1389 Encounter for screening for other disorder: Secondary | ICD-10-CM | POA: Diagnosis not present

## 2015-12-02 DIAGNOSIS — N644 Mastodynia: Secondary | ICD-10-CM | POA: Diagnosis not present

## 2015-12-02 DIAGNOSIS — Z9181 History of falling: Secondary | ICD-10-CM | POA: Diagnosis not present

## 2015-12-20 ENCOUNTER — Ambulatory Visit
Admission: RE | Admit: 2015-12-20 | Discharge: 2015-12-20 | Disposition: A | Discharge status: Home / Self Care | Payer: PPO | Source: Ambulatory Visit

## 2015-12-20 DIAGNOSIS — Z1231 Encounter for screening mammogram for malignant neoplasm of breast: Secondary | ICD-10-CM

## 2015-12-29 DIAGNOSIS — R5381 Other malaise: Secondary | ICD-10-CM | POA: Diagnosis not present

## 2015-12-29 DIAGNOSIS — E785 Hyperlipidemia, unspecified: Secondary | ICD-10-CM | POA: Diagnosis not present

## 2015-12-29 DIAGNOSIS — M8580 Other specified disorders of bone density and structure, unspecified site: Secondary | ICD-10-CM | POA: Diagnosis not present

## 2015-12-29 DIAGNOSIS — E079 Disorder of thyroid, unspecified: Secondary | ICD-10-CM | POA: Diagnosis not present

## 2016-01-02 ENCOUNTER — Encounter: Payer: Self-pay | Admitting: Gynecology

## 2016-01-02 ENCOUNTER — Ambulatory Visit (INDEPENDENT_AMBULATORY_CARE_PROVIDER_SITE_OTHER): Payer: PPO | Admitting: Gynecology

## 2016-01-02 ENCOUNTER — Other Ambulatory Visit (HOSPITAL_COMMUNITY)
Admission: RE | Admit: 2016-01-02 | Discharge: 2016-01-02 | Disposition: A | Discharge status: Home / Self Care | Payer: PPO | Source: Ambulatory Visit | Attending: Gynecology | Admitting: Gynecology

## 2016-01-02 VITALS — BP 130/80

## 2016-01-02 DIAGNOSIS — K900 Celiac disease: Secondary | ICD-10-CM

## 2016-01-02 DIAGNOSIS — Z01411 Encounter for gynecological examination (general) (routine) with abnormal findings: Secondary | ICD-10-CM | POA: Insufficient documentation

## 2016-01-02 DIAGNOSIS — Z01419 Encounter for gynecological examination (general) (routine) without abnormal findings: Secondary | ICD-10-CM | POA: Diagnosis not present

## 2016-01-02 DIAGNOSIS — M8580 Other specified disorders of bone density and structure, unspecified site: Secondary | ICD-10-CM

## 2016-01-02 DIAGNOSIS — Z780 Asymptomatic menopausal state: Secondary | ICD-10-CM | POA: Diagnosis not present

## 2016-01-02 MED ORDER — NONFORMULARY OR COMPOUNDED ITEM: Status: DC

## 2016-01-02 NOTE — Patient Instructions (Signed)
Bone Densitometry Bone densitometry is an imaging test that uses a special X-ray to measure the amount of calcium and other minerals in your bones (bone density). This test is also known as a bone mineral density test or dual-energy X-ray absorptiometry (DXA). The test can measure bone density at your hip and your spine. It is similar to having a regular X-ray. You may have this test to:  Diagnose a condition that causes weak or thin bones (osteoporosis).  Predict your risk of a broken bone (fracture).  Determine how well osteoporosis treatment is working. LET [facility] CARE PROVIDER KNOW ABOUT:  Any allergies you have.  All medicines you are taking, including vitamins, herbs, eye drops, creams, and over-the-counter medicines.  Previous problems you or members of your family have had with the use of anesthetics.  Any blood disorders you have.  Previous surgeries you have had.  Medical conditions you have.  Possibility of pregnancy.  Any other medical test you had within the previous 14 days that used contrast material. RISKS AND COMPLICATIONS Generally, this is a safe procedure. However, problems can occur and may include the following:  This test exposes you to a very small amount of radiation.  The risks of radiation exposure may be greater to unborn children. BEFORE THE PROCEDURE  Do not take any calcium supplements for 24 hours before having the test. You can otherwise eat and drink what you usually do.  Take off all metal jewelry, eyeglasses, dental appliances, and any other metal objects. PROCEDURE  You may lie on an exam table. There will be an X-ray generator below you and an imaging device above you.  Other devices, such as boxes or braces, may be used to position your body properly for the scan.  You will need to lie still while the machine slowly scans your body.  The images will show up on a computer monitor. AFTER THE PROCEDURE You may need more testing  at a later time.   This information is not intended to replace advice given to you by your health care provider. Make sure you discuss any questions you have with your health care provider.   Document Released: 11/13/2004 Document Revised: 11/12/2014 Document Reviewed: 04/01/2014 Elsevier Interactive Patient Education [employer].

## 2016-01-02 NOTE — Progress Notes (Signed)
Kerry Lewis Apr 13, 1951 [id number]   History:    65 y.o.  for annual gyn exam who has done well with vaginal estrogen twice a week to help with her vaginal atrophy. Her primary care physician is Dr. Delena Bali in [place], [place] who is been doing her blood work which she brought with her for [place] to skin and put in as per electronic record system. She had the following test results which were normal: Fasting lipid profile, CBC, thyroid panel, and vitamin D. Patient has had in the past vitamin D deficiency she also has suffered in the past from celiac sprue disease for which she is on a gluten-free diet.Patient with past history of having been placed on Reclast IV by her PCP which she was on every other year for 4 years and then was switched of to [place] which she took for one year only. She is currently on a drug holiday. Her last bone density study in 04/22/2014 demonstrated the following:  The lowest T. score was at the left femoral neck with a value -2.1  COMPARISON: Comparison is made to 04/28/2012; since the prior exam  there has been a significant 4.8% increase in bone mineral density  of the lumbar spine and no significant interval change in bone  mineral density of the left hip.  Patient had benign colon polyps removed 10 years ago. Last colonoscopy in 2015 was normal. Patient with past history of basal cell carcinoma the face is being followed by her dermatologist on a regular basis patient with no past history of any abnormal Pap smear.   Past medical history,surgical history, family history and social history were all reviewed and documented in the EPIC chart.  Gynecologic History No LMP recorded. Patient is postmenopausal. Contraception: post menopausal status Last Pap: 2015 in 2016. Results were: normal Last mammogram: 2017. Results were: normal  Obstetric History OB History  Gravida Para Term Preterm AB SAB TAB Ectopic Multiple Living  3 2   1 1    2     #  Outcome Date GA Lbr Len/2nd Weight Sex Delivery Anes PTL Lv  3 SAB           2 Para           1 Para                ROS: A ROS was performed and pertinent positives and negatives are included in the history.  GENERAL: No fevers or chills. HEENT: No change in vision, no earache, sore throat or sinus congestion. NECK: No pain or stiffness. CARDIOVASCULAR: No chest pain or pressure. No palpitations. PULMONARY: No shortness of breath, cough or wheeze. GASTROINTESTINAL: No abdominal pain, nausea, vomiting or diarrhea, melena or bright red blood per rectum. GENITOURINARY: No urinary frequency, urgency, hesitancy or dysuria. MUSCULOSKELETAL: No joint or muscle pain, no back pain, no recent trauma. DERMATOLOGIC: No rash, no itching, no lesions. ENDOCRINE: No polyuria, polydipsia, no heat or cold intolerance. No recent change in weight. HEMATOLOGICAL: No anemia or easy bruising or bleeding. NEUROLOGIC: No headache, seizures, numbness, tingling or weakness. PSYCHIATRIC: No depression, no loss of interest in normal activity or change in sleep pattern.     Exam: chaperone present  BP 130/80 mmHg  There is no weight on file to calculate BMI.  General appearance : Well developed well nourished female. No acute distress HEENT: Eyes: no retinal hemorrhage or exudates,  Neck supple, trachea midline, no carotid bruits, no thyroidmegaly Lungs: Clear to  auscultation, no rhonchi or wheezes, or rib retractions  Heart: Regular rate and rhythm, no murmurs or gallops Breast:Examined in sitting and supine position were symmetrical in appearance, no palpable masses or tenderness,  no skin retraction, no nipple inversion, no nipple discharge, no skin discoloration, no axillary or supraclavicular lymphadenopathy Abdomen: no palpable masses or tenderness, no rebound or guarding Extremities: no edema or skin discoloration or tenderness  Pelvic:  Bartholin, Urethra, Skene Glands: Within normal limits              Vagina: No gross lesions or discharge, atrophic changes  Cervix: No gross lesions or discharge  Uterus  anteverted, normal size, shape and consistency, non-tender and mobile  Adnexa  Without masses or tenderness  Anus and perineum  normal   Rectovaginal  normal sphincter tone without palpated masses or tenderness             Hemoccult PCP will provide     Assessment/Plan:  65 y.o. female for annual exam had her final Pap smear done today. The new guidelines were discussed. She's going to make an appointment with her PCP because she states that at time she's going up a flight of steps or running she gets short of breath. On cardiac auscultation did not hear any abnormalities. She is going to schedule her bone density study for July of this year. We discussed importance of calcium vitamin D and would been exercises for osteoporosis prevention.   Terrance Mass MD, 11:24 AM 01/02/2016

## 2016-01-03 LAB — CYTOLOGY - PAP

## 2016-04-10 DIAGNOSIS — D2239 Melanocytic nevi of other parts of face: Secondary | ICD-10-CM | POA: Diagnosis not present

## 2016-04-13 DIAGNOSIS — Z6823 Body mass index (BMI) 23.0-23.9, adult: Secondary | ICD-10-CM | POA: Diagnosis not present

## 2016-04-13 DIAGNOSIS — K589 Irritable bowel syndrome without diarrhea: Secondary | ICD-10-CM | POA: Diagnosis not present

## 2016-04-13 DIAGNOSIS — B009 Herpesviral infection, unspecified: Secondary | ICD-10-CM | POA: Diagnosis not present

## 2016-04-13 DIAGNOSIS — S39012A Strain of muscle, fascia and tendon of lower back, initial encounter: Secondary | ICD-10-CM | POA: Diagnosis not present

## 2016-04-23 DIAGNOSIS — M545 Low back pain: Secondary | ICD-10-CM | POA: Diagnosis not present

## 2016-05-14 DIAGNOSIS — H538 Other visual disturbances: Secondary | ICD-10-CM | POA: Diagnosis not present

## 2016-07-16 DIAGNOSIS — H52203 Unspecified astigmatism, bilateral: Secondary | ICD-10-CM | POA: Diagnosis not present

## 2016-07-18 ENCOUNTER — Ambulatory Visit
Admission: RE | Admit: 2016-07-18 | Discharge: 2016-07-18 | Disposition: A | Discharge status: Home / Self Care | Payer: PPO | Source: Ambulatory Visit | Attending: Gynecology | Admitting: Gynecology

## 2016-07-18 DIAGNOSIS — M8580 Other specified disorders of bone density and structure, unspecified site: Secondary | ICD-10-CM

## 2016-07-18 DIAGNOSIS — Z780 Asymptomatic menopausal state: Secondary | ICD-10-CM

## 2016-07-18 DIAGNOSIS — M85852 Other specified disorders of bone density and structure, left thigh: Secondary | ICD-10-CM | POA: Diagnosis not present

## 2016-07-18 DIAGNOSIS — K900 Celiac disease: Secondary | ICD-10-CM

## 2016-07-31 ENCOUNTER — Telehealth: Payer: Self-pay | Admitting: *Deleted

## 2016-07-31 NOTE — Telephone Encounter (Signed)
Pt had dexa done on 07/18/16, pt would like results if any change. Please advise

## 2016-07-31 NOTE — Telephone Encounter (Signed)
Left message for pt to call.

## 2016-07-31 NOTE — Telephone Encounter (Signed)
Please inform her that I reviewed her bone density study. She has mild osteopenia. There was statistically improvement on her bone mineralization of her spine and her hips are stable. Her 10 year fracture risk analysis is below threshold which means she is not at risk for fracture. Continue with her calcium and vitamin D and weightbearing exercises no treatment recommended.

## 2016-07-31 NOTE — Telephone Encounter (Signed)
Pt informed with results.

## 2016-09-08 DIAGNOSIS — R300 Dysuria: Secondary | ICD-10-CM | POA: Diagnosis not present

## 2016-09-25 DIAGNOSIS — L659 Nonscarring hair loss, unspecified: Secondary | ICD-10-CM | POA: Diagnosis not present

## 2016-09-25 DIAGNOSIS — L570 Actinic keratosis: Secondary | ICD-10-CM | POA: Diagnosis not present

## 2016-10-24 DIAGNOSIS — J069 Acute upper respiratory infection, unspecified: Secondary | ICD-10-CM | POA: Diagnosis not present

## 2016-12-11 DIAGNOSIS — D1801 Hemangioma of skin and subcutaneous tissue: Secondary | ICD-10-CM | POA: Diagnosis not present

## 2016-12-11 DIAGNOSIS — L659 Nonscarring hair loss, unspecified: Secondary | ICD-10-CM | POA: Diagnosis not present

## 2016-12-11 DIAGNOSIS — L570 Actinic keratosis: Secondary | ICD-10-CM | POA: Diagnosis not present

## 2016-12-11 DIAGNOSIS — L814 Other melanin hyperpigmentation: Secondary | ICD-10-CM | POA: Diagnosis not present

## 2016-12-11 DIAGNOSIS — L821 Other seborrheic keratosis: Secondary | ICD-10-CM | POA: Diagnosis not present

## 2016-12-11 DIAGNOSIS — L118 Other specified acantholytic disorders: Secondary | ICD-10-CM | POA: Diagnosis not present

## 2016-12-11 DIAGNOSIS — Z85828 Personal history of other malignant neoplasm of skin: Secondary | ICD-10-CM | POA: Diagnosis not present

## 2017-01-10 ENCOUNTER — Other Ambulatory Visit: Payer: Self-pay | Admitting: Gynecology

## 2017-01-10 DIAGNOSIS — Z1231 Encounter for screening mammogram for malignant neoplasm of breast: Secondary | ICD-10-CM

## 2017-02-06 ENCOUNTER — Ambulatory Visit
Admission: RE | Admit: 2017-02-06 | Discharge: 2017-02-06 | Disposition: A | Discharge status: Home / Self Care | Payer: PPO | Source: Ambulatory Visit | Attending: Gynecology | Admitting: Gynecology

## 2017-02-06 DIAGNOSIS — Z1231 Encounter for screening mammogram for malignant neoplasm of breast: Secondary | ICD-10-CM | POA: Diagnosis not present

## 2017-02-15 DIAGNOSIS — K900 Celiac disease: Secondary | ICD-10-CM | POA: Diagnosis not present

## 2017-02-15 DIAGNOSIS — K580 Irritable bowel syndrome with diarrhea: Secondary | ICD-10-CM | POA: Diagnosis not present

## 2017-02-19 DIAGNOSIS — R1013 Epigastric pain: Secondary | ICD-10-CM | POA: Diagnosis not present

## 2017-02-19 DIAGNOSIS — K900 Celiac disease: Secondary | ICD-10-CM | POA: Diagnosis not present

## 2017-02-25 DIAGNOSIS — K222 Esophageal obstruction: Secondary | ICD-10-CM | POA: Diagnosis not present

## 2017-02-25 DIAGNOSIS — K2950 Unspecified chronic gastritis without bleeding: Secondary | ICD-10-CM | POA: Diagnosis not present

## 2017-02-25 DIAGNOSIS — R1013 Epigastric pain: Secondary | ICD-10-CM | POA: Diagnosis not present

## 2017-02-25 DIAGNOSIS — Z800 Family history of malignant neoplasm of digestive organs: Secondary | ICD-10-CM | POA: Diagnosis not present

## 2017-02-25 DIAGNOSIS — K228 Other specified diseases of esophagus: Secondary | ICD-10-CM | POA: Diagnosis not present

## 2017-02-25 DIAGNOSIS — K200 Eosinophilic esophagitis: Secondary | ICD-10-CM | POA: Diagnosis not present

## 2017-02-25 DIAGNOSIS — K900 Celiac disease: Secondary | ICD-10-CM | POA: Diagnosis not present

## 2017-02-25 DIAGNOSIS — K219 Gastro-esophageal reflux disease without esophagitis: Secondary | ICD-10-CM | POA: Diagnosis not present

## 2017-02-25 DIAGNOSIS — D131 Benign neoplasm of stomach: Secondary | ICD-10-CM | POA: Diagnosis not present

## 2017-02-25 DIAGNOSIS — K317 Polyp of stomach and duodenum: Secondary | ICD-10-CM | POA: Diagnosis not present

## 2017-02-25 DIAGNOSIS — Z79899 Other long term (current) drug therapy: Secondary | ICD-10-CM | POA: Diagnosis not present

## 2017-02-25 HISTORY — PX: UPPER GI ENDOSCOPY: SHX6162

## 2017-03-13 ENCOUNTER — Ambulatory Visit (INDEPENDENT_AMBULATORY_CARE_PROVIDER_SITE_OTHER): Payer: PPO | Admitting: Allergy and Immunology

## 2017-03-13 ENCOUNTER — Encounter: Payer: Self-pay | Admitting: Allergy and Immunology

## 2017-03-13 VITALS — BP 118/76 | HR 72 | Temp 99.0°F | Resp 20 | Ht 63.07 in | Wt 136.6 lb

## 2017-03-13 DIAGNOSIS — K200 Eosinophilic esophagitis: Secondary | ICD-10-CM

## 2017-03-13 NOTE — Patient Instructions (Addendum)
  1. Allergen avoidance measures?  2. Dairy elimination diet?  3. Four food elimination diet (FFED)?  4. Use Flovent 110 2 puffs and swallows twice a day  5. Continue pantoprazole and ranitidine  6. Return to clinic in 12 weeks or earlier if problem

## 2017-03-13 NOTE — Progress Notes (Signed)
Dear Dr. Lyndel Safe,  Thank you for referring Kerry Lewis to the [facility] on 03/13/2017.   Below is a summation of this patient's evaluation and recommendations.  Thank you for your referral. I will keep you informed about this patient's response to treatment.   If you have any questions please do not hesitate to contact me.   Sincerely,  Jiles Prows, MD Allergy / Immunology [facility]   ______________________________________________________________________    NEW PATIENT NOTE  Referring Provider: Jackquline Denmark, MD Primary Provider: Nicoletta Dress, MD Date of office visit: 03/13/2017    Subjective:   Chief Complaint:  Kerry Lewis (DOB: 07/31/1951) is a 66 y.o. female who presents to the clinic on 03/13/2017 with a chief complaint of Other (eosinophilic esophagitis) .     HPI: Kerry Lewis presents to this clinic in evaluation of eosinophilic esophagitis diagnosed by Dr. Lyndel Safe with an upper endoscopy and biopsy performed 02/25/2017.  Kerry Lewis states that over the course of the past 7-8 months she has noticed esophageal obstruction with food getting hung up in her "throat" requiring her to vomit. As well, over the course of the past 2 months or so she has developed a "rawness" in her epigastric region. She underwent endoscopy with Dr. Lyndel Safe and her biopsy identified excess of 40 eosinophils per high-power field in her esophagus. She was recently started on swallowed Flovent and PPI and H2 receptor blocker.  She is gluten-free and has been so since 2001 for celiac disease. She does consume dairy and egg and legume without problem. She has no other associated atopic disease. She does drink one cup of coffee per day.  Past Medical History:  Diagnosis Date  . Cancer ([place])    BASAL CELL CARCINOMA ON FACE  . Celiac disease   . Eosinophilic esophagitis   . Hypothyroidism   . IBS  (irritable bowel syndrome)     Past Surgical History:  Procedure Laterality Date  . PARATHYROIDECTOMY    . TONSILLECTOMY      Allergies as of 03/13/2017      Reactions   Gluten Meal Nausea Only   Celiac disease      Medication List      Biotin 5000 MCG Tabs Take by mouth daily.   diphenoxylate-atropine 2.5-0.025 MG tablet Commonly known as:  LOMOTIL Take by mouth 4 (four) times daily as needed for diarrhea or loose stools. Reported on 01/02/2016   finasteride 5 MG tablet Commonly known as:  PROSCAR Take 5 mg by mouth daily. HALF A PILL ONCE A DAY   FISH OIL PO Take by mouth.   FLOVENT HFA 110 MCG/ACT inhaler Generic drug:  fluticasone   hyoscyamine 0.125 MG tablet Commonly known as:  LEVSIN, ANASPAZ Take 0.125 mg by mouth every 4 (four) hours as needed.   multivitamin tablet Take 1 tablet by mouth daily.   NONFORMULARY OR COMPOUNDED ITEM Estradiol .02% 1 ML Prefilled Applicator Sig: apply vaginally twice a week #90 Day Supply with 4 refills   OVER THE COUNTER MEDICATION Place into both eyes daily. HydroEye drops   PROBIOTIC DAILY PO Take by mouth.   ranitidine 150 MG tablet Commonly known as:  ZANTAC Take 150 mg by mouth 2 (two) times daily.   thyroid 32.5 MG tablet Commonly known as:  ARMOUR Take 32.5 mg by mouth daily.   traZODone 100 MG tablet Commonly known as:  DESYREL Take  100 mg by mouth at bedtime.   VITAMIN D-VITAMIN K PO Take 5,000 Units by mouth daily.       Review of systems negative except as noted in HPI / PMHx or noted below:  Review of Systems  Constitutional: Negative.   HENT: Negative.   Eyes: Negative.   Respiratory: Negative.   Cardiovascular: Negative.   Gastrointestinal: Negative.   Genitourinary: Negative.   Musculoskeletal: Negative.   Skin: Negative.   Neurological: Negative.   Endo/Heme/Allergies: Negative.   Psychiatric/Behavioral: Negative.     Family History  Problem Relation Age of Onset  . Cancer  Father        ESOPHAGEAL CANCER   . Heart disease Sister   . Cancer Maternal Uncle        COLON  . Breast cancer Paternal Aunt   . Diabetes Paternal Aunt     Social History   Social History  . Marital status: Married    Spouse name: N/A  . Number of children: N/A  . Years of education: N/A   Occupational History  . Not on file.   Social History Main Topics  . Smoking status: Never Smoker  . Smokeless tobacco: Never Used  . Alcohol use No  . Drug use: No  . Sexual activity: No     Comment: 1st intercourse- 16, partners- 1   Other Topics Concern  . Not on file   Social History Narrative  . No narrative on file    Environmental and Social history  Lives in a house with a dry environment, a dog located inside the household, no carpeting in the bedroom, no plastic on the bed or pillow, and no smokers located inside the household.  Objective:   Vitals:   03/13/17 1346  BP: 118/76  Pulse: 72  Resp: 20  Temp: 99 F (37.2 C)   Height: 5' 3.07" (160.2 cm) Weight: 136 lb 9.6 oz (62 kg)  Physical Exam  Constitutional: She is well-developed, well-nourished, and in no distress.  HENT:  Head: Normocephalic. Head is without right periorbital erythema and without left periorbital erythema.  Right Ear: Tympanic membrane, external ear and ear canal normal.  Left Ear: Tympanic membrane, external ear and ear canal normal.  Nose: Nose normal. No mucosal edema or rhinorrhea.  Mouth/Throat: Uvula is midline, oropharynx is clear and moist and mucous membranes are normal. No oropharyngeal exudate.  Eyes: Conjunctivae and lids are normal. Pupils are equal, round, and reactive to light.  Neck: Trachea normal. No tracheal tenderness present. No tracheal deviation present. No thyromegaly present.  Cardiovascular: Normal rate, regular rhythm, S1 normal, S2 normal and normal heart sounds.   No murmur heard. Pulmonary/Chest: Effort normal and breath sounds normal. No stridor. No  tachypnea. No respiratory distress. She has no wheezes. She has no rales. She exhibits no tenderness.  Abdominal: Soft. She exhibits no distension and no mass. There is no hepatosplenomegaly. There is no tenderness. There is no rebound and no guarding.  Musculoskeletal: She exhibits no edema or tenderness.  Lymphadenopathy:       Head (right side): No tonsillar adenopathy present.       Head (left side): No tonsillar adenopathy present.    She has no cervical adenopathy.    She has no axillary adenopathy.  Neurological: She is alert. Gait normal.  Skin: No rash noted. She is not diaphoretic. No erythema. No pallor. Nails show no clubbing.  Psychiatric: Mood and affect normal.    Diagnostics: Allergy skin tests  were performed. She did not demonstrate any hypersensitivity against a screening panel of foods.  Assessment and Plan:    1. Eosinophilic esophagitis     1. Allergen avoidance measures?  2. Dairy elimination diet?  3. Four food elimination diet (FFED)?  4. Use Flovent 110 2 puffs and swallows twice a day  5. Continue pantoprazole and ranitidine  6. Return to clinic in 12 weeks or earlier if problem  I had a talk with Kerry Lewis today about her options for therapy regarding her eosinophilic esophagitis and it appears as though she will probably undergo dairy elimination diet while she continues to use while on steroids and treatment for reflux. If she fails this form of therapy then she will probably need to undergo for food elimination diet. Hopefully over the course of the next 6 months or socially able to taper off her steroids while avoiding dairy consumption and not redeveloped significant eosinophilic esophagitis. I'll see her back in his clinic in 12 weeks or earlier if there is a problem.  Jiles Prows, MD [facility]

## 2017-03-15 ENCOUNTER — Encounter: Payer: Self-pay | Admitting: *Deleted

## 2017-03-20 ENCOUNTER — Encounter: Payer: Self-pay | Admitting: Gynecology

## 2017-04-26 ENCOUNTER — Encounter: Payer: Self-pay | Admitting: Gynecology

## 2017-04-26 ENCOUNTER — Ambulatory Visit (INDEPENDENT_AMBULATORY_CARE_PROVIDER_SITE_OTHER): Payer: PPO | Admitting: Gynecology

## 2017-04-26 VITALS — BP 120/82 | Ht 60.0 in | Wt 133.0 lb

## 2017-04-26 DIAGNOSIS — N952 Postmenopausal atrophic vaginitis: Secondary | ICD-10-CM | POA: Diagnosis not present

## 2017-04-26 DIAGNOSIS — Z01419 Encounter for gynecological examination (general) (routine) without abnormal findings: Secondary | ICD-10-CM | POA: Diagnosis not present

## 2017-04-26 DIAGNOSIS — M8588 Other specified disorders of bone density and structure, other site: Secondary | ICD-10-CM | POA: Diagnosis not present

## 2017-04-26 DIAGNOSIS — M8580 Other specified disorders of bone density and structure, unspecified site: Secondary | ICD-10-CM

## 2017-04-26 NOTE — Progress Notes (Signed)
Kerry Lewis 1951-10-25 [id number]   History:    66 y.o.  for annual gyn exam complaining of vaginal dryness and dyspareunia. Patient had been on estrogen cream twice a week but still continues to suffer from vaginal dryness. Patient has history of celiac sprue and has had history vitamin D deficiency and has been followed by her PCP. She had a bone density study in 2017 demonstrated the lowest T score was at the left femoral neck with a value of -1.9 and normal Frax analysis. Patient currently on steroid as a result of Eosinophilic Esophagitis. She is taking her vitamin D daily. Review of patient's record indicated that her PCP in [place] the past had her on Reclast IV and then had switched her to [facility] and she has been on a drug holiday. Patient thought that she only had osteopenia was not sure if she ever did have osteoporosis. Patient with no previous history of any abnormal Pap smears. Patient had a normal colonoscopy in 2015.  Past medical history,surgical history, family history and social history were all reviewed and documented in the EPIC chart.  Gynecologic History No LMP recorded. Patient is postmenopausal. Contraception: post menopausal status Last Pap: 2017. Results were: normal Last mammogram: 2018. Results were: normal  Obstetric History OB History  Gravida Para Term Preterm AB Living  3 2     1 2   SAB TAB Ectopic Multiple Live Births  1            # Outcome Date GA Lbr Len/2nd Weight Sex Delivery Anes PTL Lv  3 SAB           2 Para           1 Para                ROS: A ROS was performed and pertinent positives and negatives are included in the history.  GENERAL: No fevers or chills. HEENT: No change in vision, no earache, sore throat or sinus congestion. NECK: No pain or stiffness. CARDIOVASCULAR: No chest pain or pressure. No palpitations. PULMONARY: No shortness of breath, cough or wheeze. GASTROINTESTINAL: No abdominal pain, nausea, vomiting or diarrhea,  melena or bright red blood per rectum. GENITOURINARY: No urinary frequency, urgency, hesitancy or dysuria. MUSCULOSKELETAL: No joint or muscle pain, no back pain, no recent trauma. DERMATOLOGIC: No rash, no itching, no lesions. ENDOCRINE: No polyuria, polydipsia, no heat or cold intolerance. No recent change in weight. HEMATOLOGICAL: No anemia or easy bruising or bleeding. NEUROLOGIC: No headache, seizures, numbness, tingling or weakness. PSYCHIATRIC: No depression, no loss of interest in normal activity or change in sleep pattern.     Exam: chaperone present  BP 120/82   Ht 5' (1.524 m)   Wt 133 lb (60.3 kg)   BMI 25.97 kg/m   Body mass index is 25.97 kg/m.  General appearance : Well developed well nourished female. No acute distress HEENT: Eyes: no retinal hemorrhage or exudates,  Neck supple, trachea midline, no carotid bruits, no thyroidmegaly Lungs: Clear to auscultation, no rhonchi or wheezes, or rib retractions  Heart: Regular rate and rhythm, no murmurs or gallops Breast:Examined in sitting and supine position were symmetrical in appearance, no palpable masses or tenderness,  no skin retraction, no nipple inversion, no nipple discharge, no skin discoloration, no axillary or supraclavicular lymphadenopathy Abdomen: no palpable masses or tenderness, no rebound or guarding Extremities: no edema or skin discoloration or tenderness  Pelvic:  Bartholin, Urethra, Skene Glands: Within normal  limits             Vagina: No gross lesions or discharge, atrophic changes  Cervix: No gross lesions or discharge  Uterus  anteverted, normal size, shape and consistency, non-tender and mobile  Adnexa  Without masses or tenderness  Anus and perineum  normal   Rectovaginal  normal sphincter tone without palpated masses or tenderness             Hemoccult PCP provides     Assessment/Plan:  66 y.o. female for annual exam with vaginal dryness and irritation minimal relief with vaginal estrogen we  are going to prescribe her Intrarosa vaginal suppository daily at bedtime. Pap smear no longer indicated. Patient due for bone density study next year. PCP is been doing her blood work. We discussed importance of calcium vitamin D and weightbearing exercises for osteoporosis prevention.   Terrance Mass MD, 12:45 PM 04/26/2017

## 2017-05-06 DIAGNOSIS — L658 Other specified nonscarring hair loss: Secondary | ICD-10-CM | POA: Diagnosis not present

## 2017-06-06 ENCOUNTER — Encounter: Payer: Self-pay | Admitting: Allergy and Immunology

## 2017-06-06 ENCOUNTER — Ambulatory Visit (INDEPENDENT_AMBULATORY_CARE_PROVIDER_SITE_OTHER): Payer: PPO | Admitting: Allergy and Immunology

## 2017-06-06 VITALS — BP 112/60 | HR 76 | Resp 20

## 2017-06-06 DIAGNOSIS — K200 Eosinophilic esophagitis: Secondary | ICD-10-CM

## 2017-06-06 DIAGNOSIS — K900 Celiac disease: Secondary | ICD-10-CM | POA: Diagnosis not present

## 2017-06-06 NOTE — Progress Notes (Signed)
Follow-up Note  Referring Provider: Nicoletta Dress, MD Primary Provider: Nicoletta Dress, MD Date of Office Visit: 06/06/2017  Subjective:   Kerry Lewis (DOB: 1951-06-29) is a 66 y.o. female who returns to the [facility] on 06/06/2017 in re-evaluation of the following:  HPI: Kerry Lewis returns to this clinic in reevaluation of her eosinophilic esophagitis. I last saw her in this clinic May 2018  She is done well regarding her eosinophilic esophagitis and she has no abdominal discomfort at all. She still has some slow transit time of her esophageal swallowing maneuvers. She still feels as though food slows down for several seconds but never obstructs about 1 time per week or so.  She continues to use swallowed Flovent one time per day and is completely dairy free and is wheat free based upon her previous diagnosis of gluten hypersensitivity and is also soy free.  She has changed gastroenterologist and is now seeing Dr. Amedeo Plenty.  Allergies as of 06/06/2017      Reactions   Gluten Meal Nausea Only   Celiac disease      Medication List      Biotin 5000 MCG Tabs Take by mouth daily.   dicyclomine 10 MG capsule Commonly known as:  BENTYL Take 10 mg by mouth as needed.   diphenoxylate-atropine 2.5-0.025 MG tablet Commonly known as:  LOMOTIL Take by mouth 4 (four) times daily as needed for diarrhea or loose stools. Reported on 01/02/2016   finasteride 5 MG tablet Commonly known as:  PROSCAR Take 5 mg by mouth daily. HALF A PILL ONCE A DAY   FISH OIL PO Take by mouth.   FLOVENT HFA 110 MCG/ACT inhaler Generic drug:  fluticasone   hyoscyamine 0.125 MG tablet Commonly known as:  LEVSIN, ANASPAZ Take 0.125 mg by mouth every 4 (four) hours as needed.   JUICE PLUS FIBRE PO Take by mouth.   multivitamin tablet Take 1 tablet by mouth daily.   OVER THE COUNTER MEDICATION Place into both eyes daily. HydroEye drops   pantoprazole 40 MG  tablet Commonly known as:  PROTONIX Take 40 mg by mouth daily.   PROBIOTIC DAILY PO Take by mouth.   ranitidine 150 MG tablet Commonly known as:  ZANTAC Take 150 mg by mouth 2 (two) times daily.   TAZORAC 0.05 % cream Generic drug:  tazarotene   thyroid 32.5 MG tablet Commonly known as:  ARMOUR Take 32.5 mg by mouth daily.   traZODone 100 MG tablet Commonly known as:  DESYREL Take 100 mg by mouth at bedtime.   VITAMIN D-VITAMIN K PO Take 5,000 Units by mouth daily.       Past Medical History:  Diagnosis Date  . Cancer ([place])    BASAL CELL CARCINOMA ON FACE  . Celiac disease   . Eosinophilic esophagitis   . Hypothyroidism   . IBS (irritable bowel syndrome)     Past Surgical History:  Procedure Laterality Date  . PARATHYROIDECTOMY    . TONSILLECTOMY      Review of systems negative except as noted in HPI / PMHx or noted below:  Review of Systems  Constitutional: Negative.   HENT: Negative.   Eyes: Negative.   Respiratory: Negative.   Cardiovascular: Negative.   Gastrointestinal: Negative.   Genitourinary: Negative.   Musculoskeletal: Negative.   Skin: Negative.   Neurological: Negative.   Endo/Heme/Allergies: Negative.   Psychiatric/Behavioral: Negative.      Objective:   Vitals:  06/06/17 1606  BP: 112/60  Pulse: 76  Resp: 20          Physical Exam  Constitutional: She is well-developed, well-nourished, and in no distress.  HENT:  Head: Normocephalic.  Right Ear: Tympanic membrane, external ear and ear canal normal.  Left Ear: Tympanic membrane, external ear and ear canal normal.  Nose: Nose normal. No mucosal edema or rhinorrhea.  Mouth/Throat: Uvula is midline, oropharynx is clear and moist and mucous membranes are normal. No oropharyngeal exudate.  Eyes: Conjunctivae are normal.  Neck: Trachea normal. No tracheal tenderness present. No tracheal deviation present. No thyromegaly present.  Cardiovascular: Normal rate, regular rhythm,  S1 normal, S2 normal and normal heart sounds.   No murmur heard. Pulmonary/Chest: Breath sounds normal. No stridor. No respiratory distress. She has no wheezes. She has no rales.  Musculoskeletal: She exhibits no edema.  Lymphadenopathy:       Head (right side): No tonsillar adenopathy present.       Head (left side): No tonsillar adenopathy present.    She has no cervical adenopathy.  Neurological: She is alert. Gait normal.  Skin: No rash noted. She is not diaphoretic. No erythema. Nails show no clubbing.  Psychiatric: Mood and affect normal.    Diagnostics: none   Assessment and Plan:   1. Eosinophilic esophagitis     1. Continue Dairy elimination  2. Four food elimination diet (FFED)?  3. Use Flovent 110 2 puffs and swallows once a day  4. Continue pantoprazole and ranitidine  5. Return to clinic in 12 weeks or earlier if problem  6. Obtain fall flu vaccine  Kerry Lewis appears to be doing relatively well but still has some occasional slow transit time of her esophagus on occasion. I will have her continue to use her current dose of Flovent as noted above and continue on her therapy for reflux and continue on elimination diet including dairy, soy, and wheat. If she still has slow transit time of her esophagus during her next visit I would recommend that she undergo an upper endoscopy to see if she still has eosinophilic esophagitis and if so then we will probably need to change her swallowed steroid to a budesonide slurry and have her become somewhat more restrictive in her diet by eliminating egg and other legumes. I will regroup with her in 12 weeks or earlier if there is a problem.  Allena Katz, MD Allergy / Immunology [facility]

## 2017-06-06 NOTE — Patient Instructions (Addendum)
  1. Continue Dairy elimination  2. Four food elimination diet (FFED)?  3. Use Flovent 110 2 puffs and swallows once a day  4. Continue pantoprazole and ranitidine  5. Return to clinic in 12 weeks or earlier if problem  6. Obtain fall flu vaccine

## 2017-06-11 DIAGNOSIS — L821 Other seborrheic keratosis: Secondary | ICD-10-CM | POA: Diagnosis not present

## 2017-06-11 DIAGNOSIS — D485 Neoplasm of uncertain behavior of skin: Secondary | ICD-10-CM | POA: Diagnosis not present

## 2017-06-11 DIAGNOSIS — Z85828 Personal history of other malignant neoplasm of skin: Secondary | ICD-10-CM | POA: Diagnosis not present

## 2017-06-11 DIAGNOSIS — D1801 Hemangioma of skin and subcutaneous tissue: Secondary | ICD-10-CM | POA: Diagnosis not present

## 2017-06-11 DIAGNOSIS — L659 Nonscarring hair loss, unspecified: Secondary | ICD-10-CM | POA: Diagnosis not present

## 2017-06-11 DIAGNOSIS — L814 Other melanin hyperpigmentation: Secondary | ICD-10-CM | POA: Diagnosis not present

## 2017-06-11 DIAGNOSIS — L570 Actinic keratosis: Secondary | ICD-10-CM | POA: Diagnosis not present

## 2017-06-11 DIAGNOSIS — L988 Other specified disorders of the skin and subcutaneous tissue: Secondary | ICD-10-CM | POA: Diagnosis not present

## 2017-06-11 DIAGNOSIS — L820 Inflamed seborrheic keratosis: Secondary | ICD-10-CM | POA: Diagnosis not present

## 2017-06-14 DIAGNOSIS — Z6822 Body mass index (BMI) 22.0-22.9, adult: Secondary | ICD-10-CM | POA: Diagnosis not present

## 2017-06-14 DIAGNOSIS — E079 Disorder of thyroid, unspecified: Secondary | ICD-10-CM | POA: Diagnosis not present

## 2017-06-14 DIAGNOSIS — K200 Eosinophilic esophagitis: Secondary | ICD-10-CM | POA: Diagnosis not present

## 2017-06-14 DIAGNOSIS — Z139 Encounter for screening, unspecified: Secondary | ICD-10-CM | POA: Diagnosis not present

## 2017-06-14 DIAGNOSIS — Z1389 Encounter for screening for other disorder: Secondary | ICD-10-CM | POA: Diagnosis not present

## 2017-06-14 DIAGNOSIS — Z0000 Encounter for general adult medical examination without abnormal findings: Secondary | ICD-10-CM | POA: Diagnosis not present

## 2017-06-14 DIAGNOSIS — R0600 Dyspnea, unspecified: Secondary | ICD-10-CM | POA: Diagnosis not present

## 2017-06-14 DIAGNOSIS — Z9181 History of falling: Secondary | ICD-10-CM | POA: Diagnosis not present

## 2017-07-26 DIAGNOSIS — H43813 Vitreous degeneration, bilateral: Secondary | ICD-10-CM | POA: Diagnosis not present

## 2017-08-28 DIAGNOSIS — H04123 Dry eye syndrome of bilateral lacrimal glands: Secondary | ICD-10-CM | POA: Diagnosis not present

## 2017-08-28 DIAGNOSIS — H43811 Vitreous degeneration, right eye: Secondary | ICD-10-CM | POA: Diagnosis not present

## 2017-09-02 DIAGNOSIS — K200 Eosinophilic esophagitis: Secondary | ICD-10-CM | POA: Diagnosis not present

## 2017-09-02 DIAGNOSIS — K900 Celiac disease: Secondary | ICD-10-CM | POA: Diagnosis not present

## 2017-09-24 DIAGNOSIS — L909 Atrophic disorder of skin, unspecified: Secondary | ICD-10-CM

## 2017-09-24 DIAGNOSIS — L851 Acquired keratosis [keratoderma] palmaris et plantaris: Secondary | ICD-10-CM

## 2017-09-24 DIAGNOSIS — M216X1 Other acquired deformities of right foot: Secondary | ICD-10-CM | POA: Diagnosis not present

## 2017-09-24 DIAGNOSIS — M216X2 Other acquired deformities of left foot: Secondary | ICD-10-CM | POA: Insufficient documentation

## 2017-09-24 HISTORY — DX: Acquired keratosis (keratoderma) palmaris et plantaris: L85.1

## 2017-09-24 HISTORY — DX: Atrophic disorder of skin, unspecified: L90.9

## 2017-10-09 ENCOUNTER — Ambulatory Visit: Payer: PPO | Admitting: Allergy and Immunology

## 2017-10-09 ENCOUNTER — Encounter: Payer: Self-pay | Admitting: Allergy and Immunology

## 2017-10-09 VITALS — BP 128/74 | HR 88 | Resp 16

## 2017-10-09 DIAGNOSIS — K200 Eosinophilic esophagitis: Secondary | ICD-10-CM | POA: Diagnosis not present

## 2017-10-09 NOTE — Patient Instructions (Signed)
  1. Continue Dairy elimination  2. DECREASE Flovent 110 1 puffs and swallows once a day  3. Continue pantoprazole   4. Return to clinic in 12 weeks or earlier if problem

## 2017-10-09 NOTE — Progress Notes (Signed)
Follow-up Note  Referring Provider: Nicoletta Dress, MD Primary Provider: Nicoletta Dress, MD Date of Office Visit: 10/09/2017  Subjective:   Kerry Lewis (DOB: 1951/07/31) is a 66 y.o. female who returns to the [facility] on 10/09/2017 in re-evaluation of the following:  HPI: [name] returns to this clinic in reevaluation of her eosinophilic esophagitis.  Her last visit to this clinic was 06 June 2017.  She has done very well regarding her eosinophilic esophagitis.  She has no stomach complaints and her swallowing is excellent.  She is 95% better.  If she eats a hamburger she must cut it up into small pieces for good swallowing action otherwise she does great with every other type of food.  She is dairy free and gluten-free and soy free. She continues on swallowed Flovent.  Allergies as of 10/09/2017      Reactions   Gluten Meal Nausea Only   Celiac disease      Medication List      Biotin 5000 MCG Tabs Take by mouth daily.   dicyclomine 10 MG capsule Commonly known as:  BENTYL Take 10 mg by mouth as needed.   diphenoxylate-atropine 2.5-0.025 MG tablet Commonly known as:  LOMOTIL Take by mouth 4 (four) times daily as needed for diarrhea or loose stools. Reported on 01/02/2016   finasteride 5 MG tablet Commonly known as:  PROSCAR Take 5 mg by mouth daily. HALF A PILL ONCE A DAY   FISH OIL PO Take by mouth.   FLOVENT HFA 110 MCG/ACT inhaler Generic drug:  fluticasone   hyoscyamine 0.125 MG tablet Commonly known as:  LEVSIN, ANASPAZ Take 0.125 mg by mouth every 4 (four) hours as needed.   JUICE PLUS FIBRE PO Take by mouth.   multivitamin tablet Take 1 tablet by mouth daily.   OVER THE COUNTER MEDICATION Place into both eyes daily. HydroEye drops   pantoprazole 40 MG tablet Commonly known as:  PROTONIX Take 40 mg by mouth daily.   PROBIOTIC DAILY PO Take by mouth.   TAZORAC 0.05 % cream Generic drug:  tazarotene   thyroid  32.5 MG tablet Commonly known as:  ARMOUR Take 32.5 mg by mouth daily.   traZODone 100 MG tablet Commonly known as:  DESYREL Take 100 mg by mouth at bedtime.   VITAMIN D-VITAMIN K PO Take 5,000 Units by mouth daily.       Past Medical History:  Diagnosis Date  . Cancer ([place])    BASAL CELL CARCINOMA ON FACE  . Celiac disease   . Eosinophilic esophagitis   . Hypothyroidism   . IBS (irritable bowel syndrome)     Past Surgical History:  Procedure Laterality Date  . PARATHYROIDECTOMY    . TONSILLECTOMY      Review of systems negative except as noted in HPI / PMHx or noted below:  Review of Systems  Constitutional: Negative.   HENT: Negative.   Eyes: Negative.   Respiratory: Negative.   Cardiovascular: Negative.   Gastrointestinal: Negative.   Genitourinary: Negative.   Musculoskeletal: Negative.   Skin: Negative.   Neurological: Negative.   Endo/Heme/Allergies: Negative.   Psychiatric/Behavioral: Negative.      Objective:   Vitals:   10/09/17 1340  BP: 128/74  Pulse: 88  Resp: 16          Physical Exam  Constitutional: She is well-developed, well-nourished, and in no distress.  HENT:  Head: Normocephalic.  Right Ear: Tympanic membrane, external ear  and ear canal normal.  Left Ear: Tympanic membrane, external ear and ear canal normal.  Nose: Nose normal. No mucosal edema or rhinorrhea.  Mouth/Throat: Uvula is midline, oropharynx is clear and moist and mucous membranes are normal. No oropharyngeal exudate.  Eyes: Conjunctivae are normal.  Neck: Trachea normal. No tracheal tenderness present. No tracheal deviation present. No thyromegaly present.  Cardiovascular: Normal rate, regular rhythm, S1 normal, S2 normal and normal heart sounds.  No murmur heard. Pulmonary/Chest: Breath sounds normal. No stridor. No respiratory distress. She has no wheezes. She has no rales.  Musculoskeletal: She exhibits no edema.  Lymphadenopathy:       Head (right side): No  tonsillar adenopathy present.       Head (left side): No tonsillar adenopathy present.    She has no cervical adenopathy.  Neurological: She is alert. Gait normal.  Skin: No rash noted. She is not diaphoretic. No erythema. Nails show no clubbing.  Psychiatric: Mood and affect normal.    Diagnostics: none   Assessment and Plan:   1. Eosinophilic esophagitis     1. Continue Dairy elimination  2. DECREASE Flovent 110 1 puffs and swallows once a day  3. Continue pantoprazole   4. Return to clinic in 12 weeks or earlier if problem  Brie continues to do well with her current therapy and we will make an attempt to consolidate her inhaled steroid dose by 50% during today's visit.  I will see her back in this clinic in 12 weeks or earlier if there is a problem.  Allena Katz, MD Allergy / Immunology [facility]

## 2017-10-10 ENCOUNTER — Encounter: Payer: Self-pay | Admitting: Allergy and Immunology

## 2017-11-23 IMAGING — MG 2D DIGITAL SCREENING BILATERAL MAMMOGRAM WITH CAD AND ADJUNCT TO
9 of 13 series · 9 of 29 positions shown · non-contrast
Comparison: Previous exam(s).

CLINICAL DATA: Screening.

EXAM:
2D DIGITAL SCREENING BILATERAL MAMMOGRAM WITH CAD AND ADJUNCT TOMO

[L CC (1 of 2)]
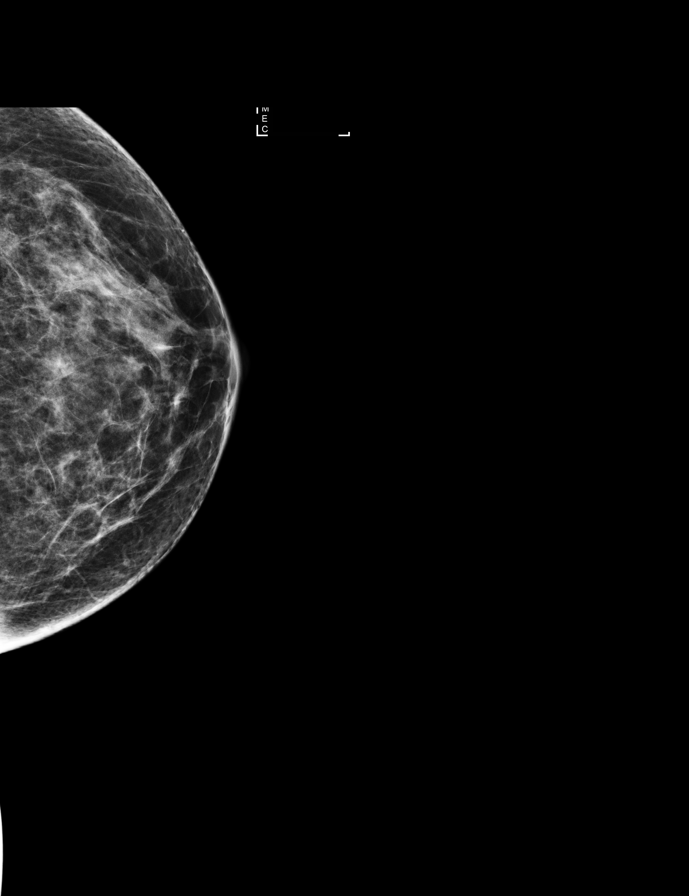

[L MLO synth-2D]
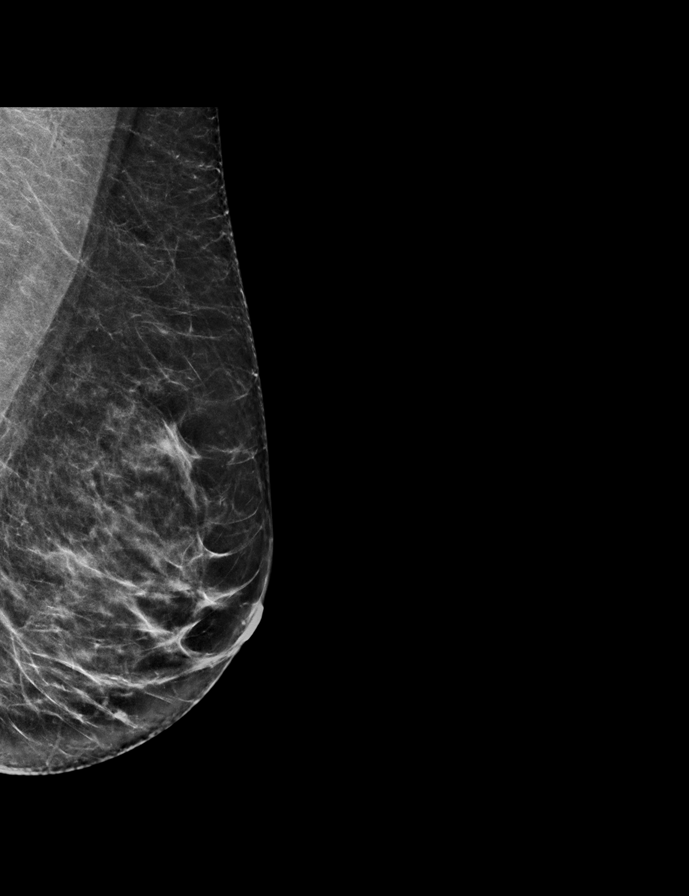

[R CC]
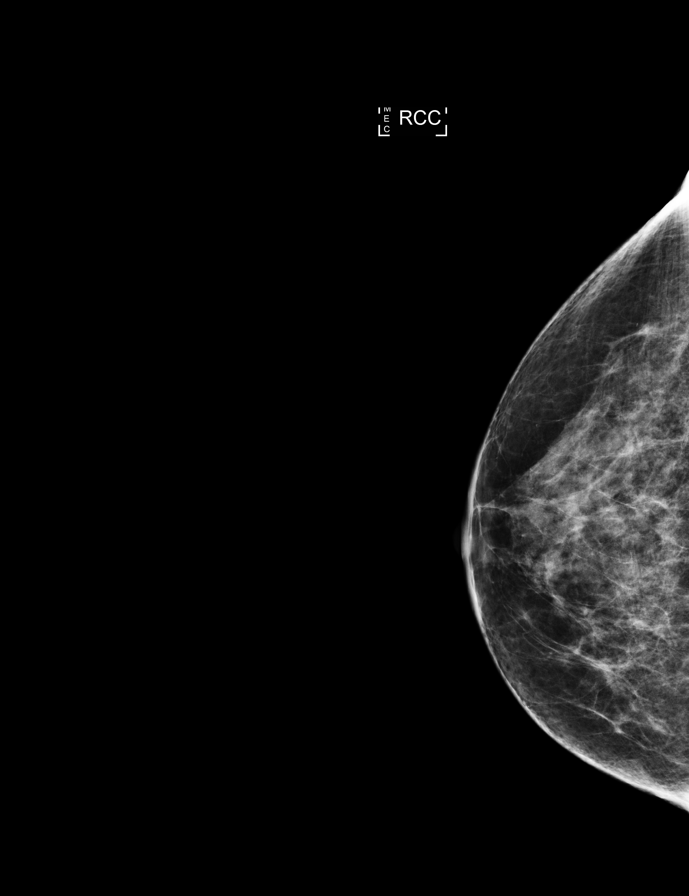

[R MLO synth-2D]
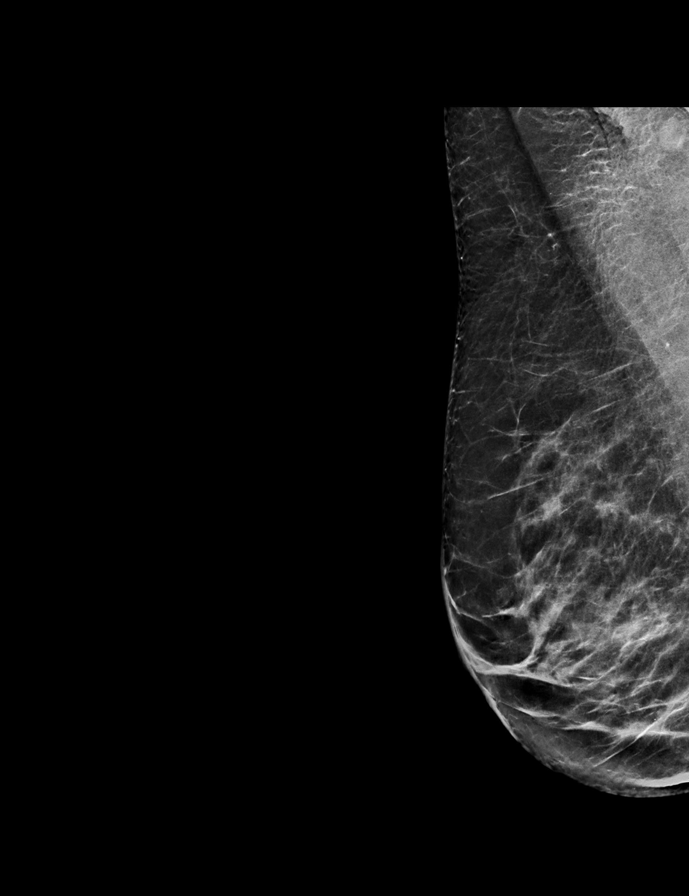

[R CC synth-2D]
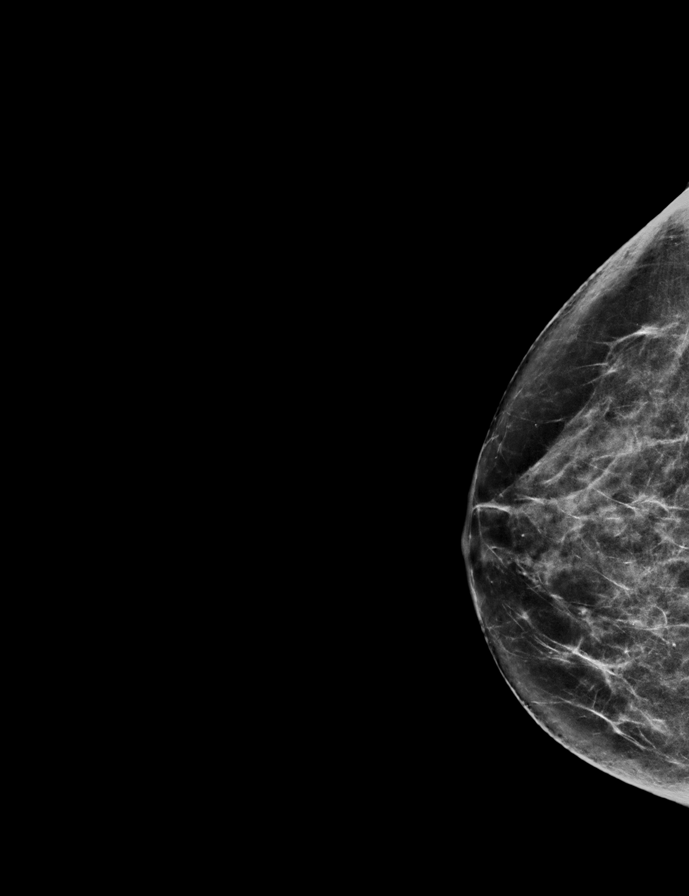

[L MLO]
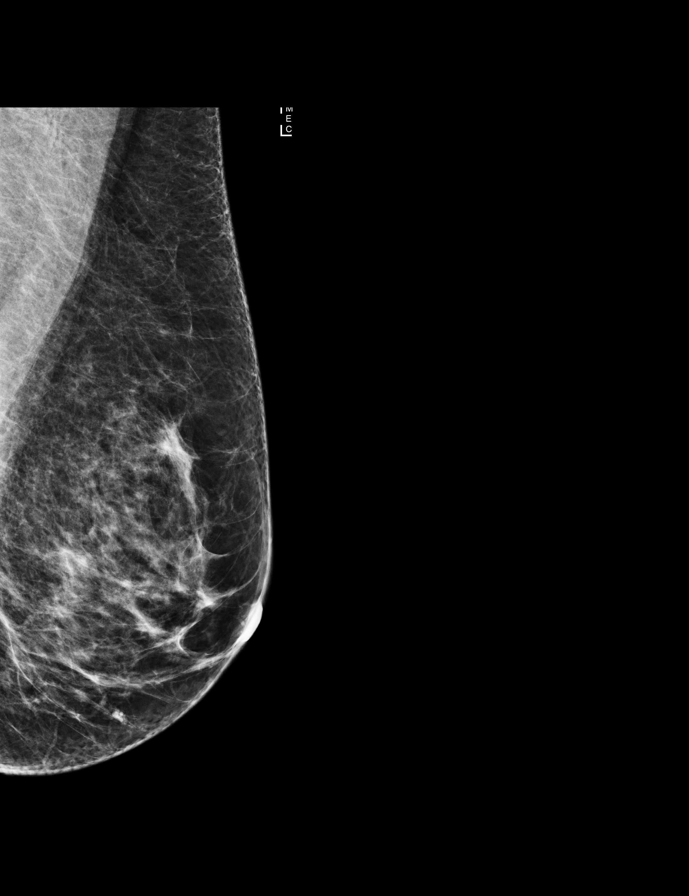

[R MLO]
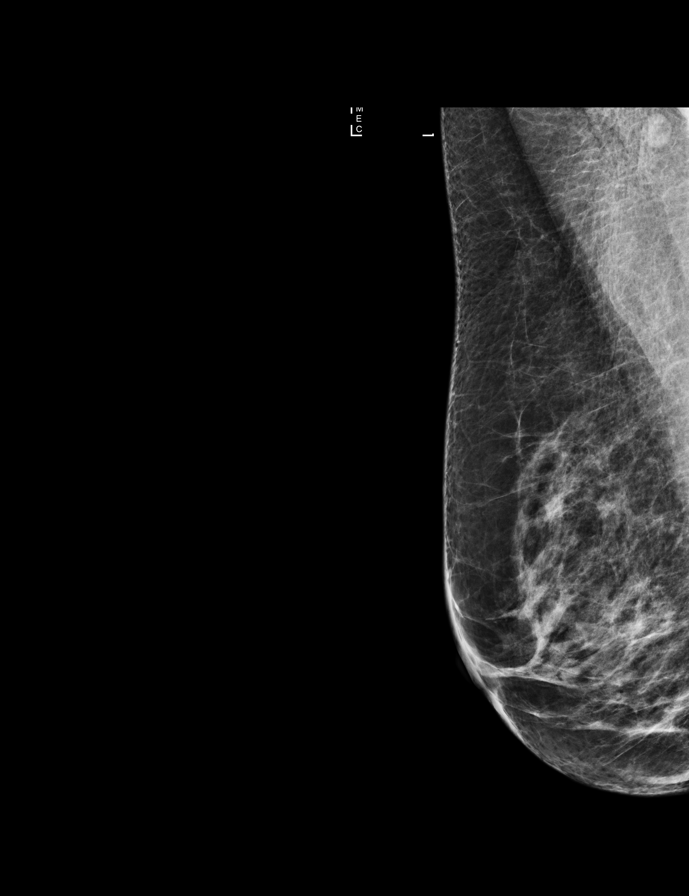

[L CC (2 of 2)]
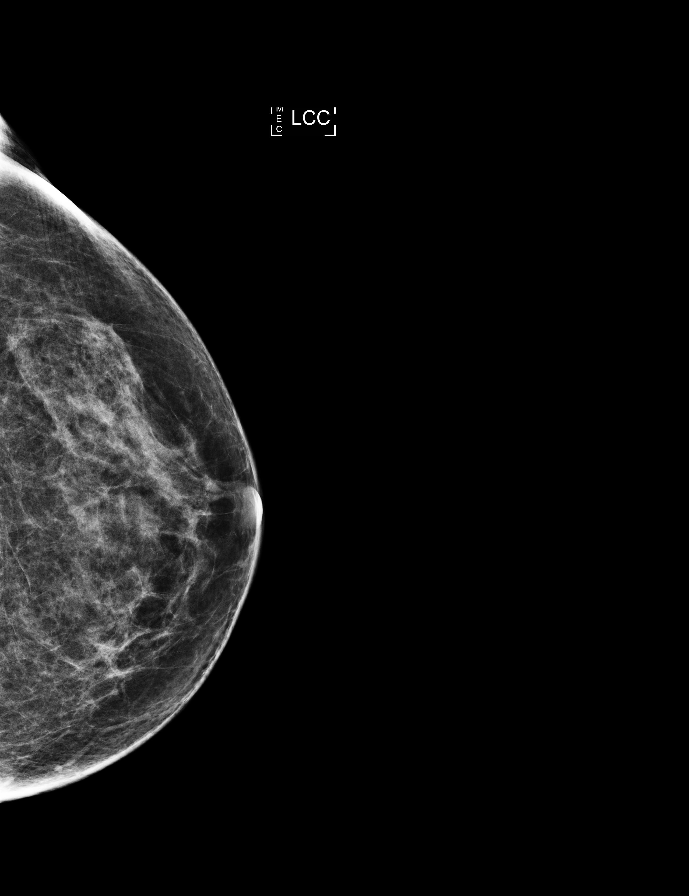

[L CC synth-2D]
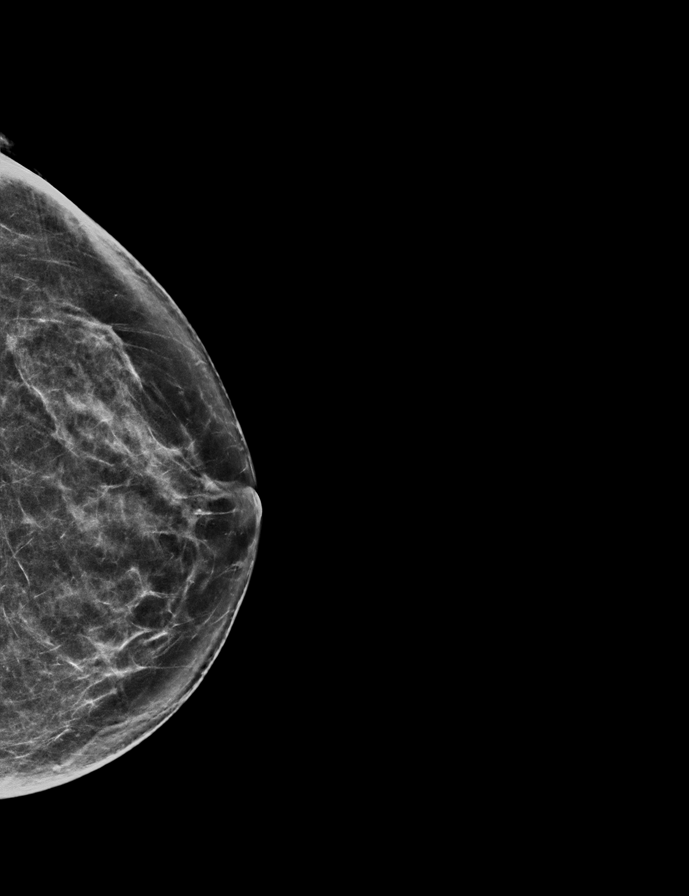

[9 of 29 positions shown; findings below may reference images not displayed]

ACR Breast Density Category c: The breast tissue is heterogeneously
dense, which may obscure small masses.
FINDINGS: There are no findings suspicious for malignancy. Images were
processed with CAD.
IMPRESSION: No mammographic evidence of malignancy. A result letter of this
screening mammogram will be mailed directly to the patient.

RECOMMENDATION:
Screening mammogram in one year. (Code:TN-[id number])

BI-RADS CATEGORY  1: Negative.

## 2017-11-26 DIAGNOSIS — R300 Dysuria: Secondary | ICD-10-CM | POA: Diagnosis not present

## 2017-12-02 DIAGNOSIS — R300 Dysuria: Secondary | ICD-10-CM | POA: Diagnosis not present

## 2017-12-02 DIAGNOSIS — R3989 Other symptoms and signs involving the genitourinary system: Secondary | ICD-10-CM | POA: Diagnosis not present

## 2017-12-02 DIAGNOSIS — N952 Postmenopausal atrophic vaginitis: Secondary | ICD-10-CM | POA: Diagnosis not present

## 2017-12-09 DIAGNOSIS — L658 Other specified nonscarring hair loss: Secondary | ICD-10-CM | POA: Diagnosis not present

## 2017-12-09 DIAGNOSIS — L219 Seborrheic dermatitis, unspecified: Secondary | ICD-10-CM | POA: Diagnosis not present

## 2017-12-09 DIAGNOSIS — L650 Telogen effluvium: Secondary | ICD-10-CM | POA: Diagnosis not present

## 2017-12-09 DIAGNOSIS — Z79899 Other long term (current) drug therapy: Secondary | ICD-10-CM | POA: Diagnosis not present

## 2017-12-10 DIAGNOSIS — L638 Other alopecia areata: Secondary | ICD-10-CM | POA: Diagnosis not present

## 2017-12-10 DIAGNOSIS — R531 Weakness: Secondary | ICD-10-CM | POA: Diagnosis not present

## 2017-12-10 DIAGNOSIS — L820 Inflamed seborrheic keratosis: Secondary | ICD-10-CM | POA: Diagnosis not present

## 2017-12-10 DIAGNOSIS — L578 Other skin changes due to chronic exposure to nonionizing radiation: Secondary | ICD-10-CM | POA: Diagnosis not present

## 2017-12-20 DIAGNOSIS — H04123 Dry eye syndrome of bilateral lacrimal glands: Secondary | ICD-10-CM | POA: Diagnosis not present

## 2017-12-31 ENCOUNTER — Encounter: Payer: Self-pay | Admitting: Gynecology

## 2017-12-31 ENCOUNTER — Ambulatory Visit: Payer: PPO | Admitting: Gynecology

## 2017-12-31 ENCOUNTER — Telehealth: Payer: Self-pay | Admitting: *Deleted

## 2017-12-31 VITALS — BP 122/70

## 2017-12-31 DIAGNOSIS — N959 Unspecified menopausal and perimenopausal disorder: Secondary | ICD-10-CM | POA: Diagnosis not present

## 2017-12-31 DIAGNOSIS — N644 Mastodynia: Secondary | ICD-10-CM

## 2017-12-31 DIAGNOSIS — Z1211 Encounter for screening for malignant neoplasm of colon: Secondary | ICD-10-CM | POA: Diagnosis not present

## 2017-12-31 DIAGNOSIS — Z0000 Encounter for general adult medical examination without abnormal findings: Secondary | ICD-10-CM | POA: Diagnosis not present

## 2017-12-31 DIAGNOSIS — Z9181 History of falling: Secondary | ICD-10-CM | POA: Diagnosis not present

## 2017-12-31 DIAGNOSIS — Z1331 Encounter for screening for depression: Secondary | ICD-10-CM | POA: Diagnosis not present

## 2017-12-31 NOTE — Telephone Encounter (Signed)
-----   Message from Anastasio Auerbach, MD sent at 12/31/2017  2:46 PM EST ----- Schedule diagnostic mammogram and ultrasound right breast at the breast center reference new onset right periareolar pain.  No palpable abnormalities.  No nipple discharge

## 2017-12-31 NOTE — Progress Notes (Signed)
    Kerry Lewis [birth year] [id number]        66 y.o.  [id number] presents with 2-3 weeks of right breast tenderness.  Notes around her nipple region her breast is very sensitive to the touch.  No history of trauma or other identifiable etiologies.  No nipple discharge or palpable abnormalities on self breast exam.  No history of same before.  Last mammogram 02/2017 was normal  Past medical history,surgical history, problem list, medications, allergies, family history and social history were all reviewed and documented in the EPIC chart.  Directed ROS with pertinent positives and negatives documented in the history of present illness/assessment and plan.  Exam: Caryn Bee assistant Vitals:   12/31/17 1417  BP: 122/70   General appearance:  Normal Both breasts examined lying and sitting without masses retractions discharge or adenopathy  Assessment/Plan:  67 y.o. [id number] with new onset right breast tenderness in the periareolar area.  No palpable or visual abnormalities on exam.  No nipple discharge.  Recommend diagnostic mammography and ultrasound.  Differential reviewed to include physiologic, small cysts, more significant pathology.  Patient will follow-up for the studies and let me know if she does not hear to arrange these within 1 week.    Anastasio Auerbach MD, 2:48 PM 12/31/2017

## 2017-12-31 NOTE — Patient Instructions (Signed)
Follow up for the breast studies as scheduled

## 2017-12-31 NOTE — Telephone Encounter (Signed)
Pt scheduled on 01/03/18 @ 12:40pm, pt informed.

## 2018-01-03 ENCOUNTER — Ambulatory Visit
Admission: RE | Admit: 2018-01-03 | Discharge: 2018-01-03 | Disposition: A | Discharge status: Home / Self Care | Payer: PPO | Source: Ambulatory Visit | Attending: Gynecology | Admitting: Gynecology

## 2018-01-03 DIAGNOSIS — N644 Mastodynia: Secondary | ICD-10-CM

## 2018-01-03 DIAGNOSIS — R922 Inconclusive mammogram: Secondary | ICD-10-CM | POA: Diagnosis not present

## 2018-02-10 DIAGNOSIS — J301 Allergic rhinitis due to pollen: Secondary | ICD-10-CM | POA: Diagnosis not present

## 2018-02-10 DIAGNOSIS — J3489 Other specified disorders of nose and nasal sinuses: Secondary | ICD-10-CM | POA: Diagnosis not present

## 2018-02-11 DIAGNOSIS — M216X2 Other acquired deformities of left foot: Secondary | ICD-10-CM | POA: Diagnosis not present

## 2018-02-11 DIAGNOSIS — M79674 Pain in right toe(s): Secondary | ICD-10-CM | POA: Insufficient documentation

## 2018-02-11 DIAGNOSIS — L851 Acquired keratosis [keratoderma] palmaris et plantaris: Secondary | ICD-10-CM | POA: Diagnosis not present

## 2018-02-11 DIAGNOSIS — M216X1 Other acquired deformities of right foot: Secondary | ICD-10-CM | POA: Diagnosis not present

## 2018-02-11 HISTORY — DX: Pain in right toe(s): M79.674

## 2018-02-25 DIAGNOSIS — D225 Melanocytic nevi of trunk: Secondary | ICD-10-CM | POA: Diagnosis not present

## 2018-02-25 DIAGNOSIS — Z85828 Personal history of other malignant neoplasm of skin: Secondary | ICD-10-CM | POA: Diagnosis not present

## 2018-02-25 DIAGNOSIS — L814 Other melanin hyperpigmentation: Secondary | ICD-10-CM | POA: Diagnosis not present

## 2018-02-25 DIAGNOSIS — L570 Actinic keratosis: Secondary | ICD-10-CM | POA: Diagnosis not present

## 2018-02-25 DIAGNOSIS — D1801 Hemangioma of skin and subcutaneous tissue: Secondary | ICD-10-CM | POA: Diagnosis not present

## 2018-02-25 DIAGNOSIS — L821 Other seborrheic keratosis: Secondary | ICD-10-CM | POA: Diagnosis not present

## 2018-03-06 DIAGNOSIS — N390 Urinary tract infection, site not specified: Secondary | ICD-10-CM | POA: Diagnosis not present

## 2018-03-18 DIAGNOSIS — R0781 Pleurodynia: Secondary | ICD-10-CM | POA: Diagnosis not present

## 2018-03-18 DIAGNOSIS — J069 Acute upper respiratory infection, unspecified: Secondary | ICD-10-CM | POA: Diagnosis not present

## 2018-04-04 ENCOUNTER — Other Ambulatory Visit: Payer: Self-pay | Admitting: Gynecology

## 2018-04-04 DIAGNOSIS — Z1231 Encounter for screening mammogram for malignant neoplasm of breast: Secondary | ICD-10-CM

## 2018-04-11 ENCOUNTER — Ambulatory Visit: Payer: PPO

## 2018-04-14 ENCOUNTER — Ambulatory Visit
Admission: RE | Admit: 2018-04-14 | Discharge: 2018-04-14 | Disposition: A | Discharge status: Home / Self Care | Payer: PPO | Source: Ambulatory Visit | Attending: Gynecology | Admitting: Gynecology

## 2018-04-14 DIAGNOSIS — Z1231 Encounter for screening mammogram for malignant neoplasm of breast: Secondary | ICD-10-CM | POA: Diagnosis not present

## 2018-04-21 ENCOUNTER — Encounter: Payer: Self-pay | Admitting: Gastroenterology

## 2018-04-21 ENCOUNTER — Telehealth: Payer: Self-pay | Admitting: Gastroenterology

## 2018-04-21 MED ORDER — HYOSCYAMINE SULFATE 0.125 MG SL SUBL: 0.1250 mg | SUBLINGUAL_TABLET | SUBLINGUAL | 0 refills | Status: DC | PRN

## 2018-04-21 NOTE — Telephone Encounter (Signed)
Sent refill to patients pharmacy.

## 2018-06-02 ENCOUNTER — Encounter: Payer: Self-pay | Admitting: Gastroenterology

## 2018-06-02 ENCOUNTER — Ambulatory Visit (INDEPENDENT_AMBULATORY_CARE_PROVIDER_SITE_OTHER): Payer: PPO | Admitting: Gastroenterology

## 2018-06-02 VITALS — BP 124/72 | HR 74 | Ht 64.0 in | Wt 124.5 lb

## 2018-06-02 DIAGNOSIS — K580 Irritable bowel syndrome with diarrhea: Secondary | ICD-10-CM

## 2018-06-02 DIAGNOSIS — K900 Celiac disease: Secondary | ICD-10-CM

## 2018-06-02 DIAGNOSIS — K200 Eosinophilic esophagitis: Secondary | ICD-10-CM

## 2018-06-02 MED ORDER — ELUXADOLINE 100 MG PO TABS: 100.0000 mg | ORAL_TABLET | Freq: Two times a day (BID) | ORAL | 5 refills | Status: DC

## 2018-06-02 MED ORDER — HYOSCYAMINE SULFATE 0.125 MG PO TABS: 0.1250 mg | ORAL_TABLET | ORAL | 11 refills | Status: DC | PRN

## 2018-06-02 MED ORDER — DICYCLOMINE HCL 10 MG PO CAPS
10.0000 mg | ORAL_CAPSULE | ORAL | 11 refills | Status: DC | PRN
Start: 2018-06-02 — End: 2022-02-14

## 2018-06-02 MED ORDER — DIPHENOXYLATE-ATROPINE 2.5-0.025 MG PO TABS: 1.0000 | ORAL_TABLET | Freq: Two times a day (BID) | ORAL | 5 refills | Status: DC

## 2018-06-02 NOTE — Progress Notes (Signed)
Chief Complaint: FU  Referring Provider:  Nicoletta Dress, MD      ASSESSMENT AND PLAN;   #1. Celiac disease: Dx 2008. Patient has been compliant with gluten-free diet.  #2. 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 Seen Dr Neldon Mc, neg allergy testing but advised gluten and lactose-free diet. Has been treated with steroid inhalers.  #3. IBS with diarrhea, rule out other causes especially lymphocytic colitis associated with celiac disease.  Patient with family history of colon cancer in a second-degree relative.  Plan: -Proceed with colonoscopy with MiraLAX preparation.  I discussed risks and benefits. -Restart Bentyl 10 mg p.o. twice daily half an hour before meals and bedtime. (Bid dose as it causes her to have dry mouth) -Start Lomotil 1 tablet p.o. nightly.  She can increase it to 1 tablet p.o. twice daily -Continue hyoscyamine 0.125 mg sublingual 1 to 2 tablets every 6-8 hours as needed. -Trial of Viberzi 140m po bid in 2 weeks if not better.  Samples given. -Continue to avoid lactose as well as suggested by Dr. KNeldon Mc Pt not having any significant dysphagia currently.  Hence, we would hold off on steroid inhalers and continue her on Protonix 40 mg p.o. once a day.  She will get in touch with [place] starts having any problems in the future. -Follow-up after the colonoscopy.   HPI:    Kerry FACKLERis a 67y.o. female  Long-standing history of diarrhea 7-8 times per day without any  nocturnal symptoms. Had negative stool studies. No melena or hematochezia. No unintentional weight loss. She has been compliant with gluten-free diet.  Her celiac antibodies were negative confirming compliance with gluten-free diet. Has been diagnosed as having IBS as well. Did well with Bentyl but had dryness of the mouth. Takes Lomotil one at night and Levsin in the morning when she goes out and plays golf. Denies having any nausea or  vomiting. Diagnosed with eosinophilic esophagitis on EGD with biopsies on 02/25/2017 with distal esophageal stricture status post dilatation to [place]  Celebrating her 50th wedding anniversary in June 2019.  Past Medical History:  Diagnosis Date  . Anxiety and depression   . [place]    BASAL CELL CARCINOMA ON FACE  . Celiac disease   . Colon polyps   . Eosinophilic esophagitis   . GERD (gastroesophageal reflux disease)   . History of basal cell carcinoma   . Hypothyroidism   . IBS (irritable bowel syndrome)   . Status post dilation of esophageal narrowing     Past Surgical History:  Procedure Laterality Date  . COLONOSCOPY  07/02/2013   Mild pancolonic diverticulosis. Small internal hemorrhoids. otherise normal.  . PARATHYROIDECTOMY    . TONSILLECTOMY    . UPPER GI ENDOSCOPY  08/06/2007   Previously dx Celiac Disease.Risidual pill @ 30cm without any underlying esophageal lesions. S/P biopsies and esophageal dilatation. Esophagus was dilated. Bx: Unremarkable small bowel mucosa with architecturally normal villi, Fungal forms consistent with Candida.  .Marland KitchenUPPER GI ENDOSCOPY  02/25/2017   Distal esophageal stricture s/p esophagel dilatation. SI Bx: benign small bowel mucosa. Esophagus Bx: Hyperplastic benign squamous mucosa with chronic inflammation and Eosinophila, focally in excess of 40 eosinophils per high power field, consistent with EOE.  Stomach Bx: Fundic glad polyps in a setting of chronic gastritis.    Family History  Problem Relation Age of Onset  . Cancer Father  ESOPHAGEAL CANCER   . Heart disease Sister   . Cancer Maternal Uncle        COLON  . Breast cancer Paternal Aunt   . Diabetes Paternal Aunt     Social History   Tobacco Use  . Smoking status: Never Smoker  . Smokeless tobacco: Never Used  Substance Use Topics  . Alcohol use: No    Alcohol/week: 0.0 oz  . Drug use: No    Current Outpatient Medications  Medication Sig Dispense  Refill  . Biotin 5000 MCG TABS Take by mouth daily.    Marland Kitchen dicyclomine (BENTYL) 10 MG capsule Take 10 mg by mouth as needed.     . diphenoxylate-atropine (LOMOTIL) 2.5-0.025 MG per tablet Take by mouth 4 (four) times daily as needed for diarrhea or loose stools. Reported on 01/02/2016    . finasteride (PROSCAR) 5 MG tablet Take 5 mg by mouth daily. HALF A PILL ONCE A DAY    . hyoscyamine (LEVSIN, ANASPAZ) 0.125 MG tablet Take 0.125 mg by mouth every 4 (four) hours as needed.    . Multiple Vitamin (MULTIVITAMIN) tablet Take 1 tablet by mouth daily.    . Multiple Vitamins-Minerals (ZINC PO) Take 1 tablet by mouth daily.    . Omega-3 Fatty Acids (FISH OIL PO) Take by mouth.    Marland Kitchen OVER THE COUNTER MEDICATION Place into both eyes daily. HydroEye drops    . pantoprazole (PROTONIX) 40 MG tablet Take 40 mg by mouth daily.     . Probiotic Product (PROBIOTIC DAILY PO) Take by mouth.    . traZODone (DESYREL) 100 MG tablet Take 100 mg by mouth at bedtime.    Marland Kitchen VITAMIN D-VITAMIN K PO Take 5,000 Units by mouth daily.     No current facility-administered medications for this visit.     Allergies  Allergen Reactions  . Dairy Aid [Lactase]   . Gluten Meal Nausea Only    Celiac disease    Review of Systems:  Constitutional: Denies fever, chills, diaphoresis, appetite change and fatigue.  HEENT: Denies photophobia, eye pain, redness, hearing loss, ear pain, congestion, sore throat, rhinorrhea, sneezing, mouth sores, neck pain, neck stiffness and tinnitus.   Respiratory: Denies SOB, DOE, cough, chest tightness,  and wheezing.   Cardiovascular: Denies chest pain, palpitations and leg swelling.  Genitourinary: Denies dysuria, urgency, frequency, hematuria, flank pain and difficulty urinating.  Musculoskeletal: Denies myalgias, back pain, joint swelling, arthralgias and gait problem.  Skin: No rash.  Neurological: Denies dizziness, seizures, syncope, weakness, light-headedness, numbness and headaches.    Hematological: Denies adenopathy. Easy bruising, personal or family bleeding history  Psychiatric/Behavioral: No anxiety or depression     Physical Exam:    BP 124/72   Pulse 74   Ht 5' 4"  (1.626 m)   Wt 124 lb 8 oz (56.5 kg)   BMI 21.37 kg/m  Filed Weights   06/02/18 1352  Weight: 124 lb 8 oz (56.5 kg)   Constitutional:  Well-developed, in no acute distress. Psychiatric: Normal mood and affect. Behavior is normal. HEENT: Pupils normal.  Conjunctivae are normal. No scleral icterus. Neck supple.  Cardiovascular: Normal rate, regular rhythm. No edema Pulmonary/chest: Effort normal and breath sounds normal. No wheezing, rales or rhonchi. Abdominal: Soft, nondistended. Nontender. Bowel sounds active throughout. There are no masses palpable. No hepatomegaly. Rectal:  defered Neurological: Alert and oriented to person place and time. Skin: Skin is warm and dry. No rashes noted.     Carmell Austria, MD 06/02/2018, 2:18 PM  Cc: Nicoletta Dress, MD

## 2018-06-02 NOTE — Patient Instructions (Addendum)
If you are age 69 or older, your body mass index should be between 23-30. Your Body mass index is 21.37 kg/m. If this is out of the aforementioned range listed, please consider follow up with your Primary Care Provider.  If you are age 76 or younger, your body mass index should be between 19-25. Your Body mass index is 21.37 kg/m. If this is out of the aformentioned range listed, please consider follow up with your Primary Care Provider.   You have been scheduled for a colonoscopy. Please follow written instructions given to you at your visit today.  Please pick up your prep supplies at the pharmacy within the next 1-3 days. If you use inhalers (even only as needed), please bring them with you on the day of your procedure. Your physician has requested that you go to www.startemmi.com and enter the access code given to you at your visit today. This web site gives a general overview about your procedure. However, you should still follow specific instructions given to you by our office regarding your preparation for the procedure.  Please purchase the following medications over the counter and take as directed: Miralax  We have sent the following medications to your pharmacy for you to pick up at your convenience: Bentyl 68m by mouth twice daily. Lomotil once daily at bedtime, may increase to twice daily. Levsin under the tongue every 4 hours as needed.  You have been given samples of  Viberzi 1077m In two weeks being taking one tablet twice daily.  Thank you,  Dr. RaJackquline Denmark

## 2018-06-16 ENCOUNTER — Encounter: Payer: Self-pay | Admitting: Gastroenterology

## 2018-06-16 ENCOUNTER — Ambulatory Visit (AMBULATORY_SURGERY_CENTER): Payer: PPO | Admitting: Gastroenterology

## 2018-06-16 VITALS — BP 120/63 | HR 64 | Temp 98.4°F | Resp 15 | Ht 64.0 in | Wt 124.0 lb

## 2018-06-16 DIAGNOSIS — K589 Irritable bowel syndrome without diarrhea: Secondary | ICD-10-CM | POA: Diagnosis not present

## 2018-06-16 DIAGNOSIS — K588 Other irritable bowel syndrome: Secondary | ICD-10-CM | POA: Diagnosis not present

## 2018-06-16 DIAGNOSIS — K319 Disease of stomach and duodenum, unspecified: Secondary | ICD-10-CM | POA: Diagnosis not present

## 2018-06-16 DIAGNOSIS — K599 Functional intestinal disorder, unspecified: Secondary | ICD-10-CM | POA: Diagnosis not present

## 2018-06-16 DIAGNOSIS — R197 Diarrhea, unspecified: Secondary | ICD-10-CM | POA: Diagnosis not present

## 2018-06-16 DIAGNOSIS — K900 Celiac disease: Secondary | ICD-10-CM | POA: Diagnosis not present

## 2018-06-16 DIAGNOSIS — K219 Gastro-esophageal reflux disease without esophagitis: Secondary | ICD-10-CM | POA: Diagnosis not present

## 2018-06-16 MED ORDER — SODIUM CHLORIDE 0.9 % IV SOLN: 500.0000 mL | Freq: Once | INTRAVENOUS | Status: DC

## 2018-06-16 NOTE — Progress Notes (Signed)
Pt's states no medical or surgical changes since previsit or office visit. 

## 2018-06-16 NOTE — Op Note (Signed)
[facility] Patient Name: Kerry Lewis Procedure Date: 06/16/2018 1:26 PM MRN: [id number] Endoscopist: Jackquline Denmark , MD Age: 67 Referring MD:  Date of Birth: 03-18-1951 Gender: Female Account #: [id number] Procedure:                Colonoscopy Indications:              Chronic diarrhea - IBS with diarrhea, rule out                            other causes especially lymphocytic colitis                            associated with celiac disease. Patient with family                            history of colon cancer in a second-degree relative. Medicines:                Monitored Anesthesia Care Procedure:                Pre-Anesthesia Assessment:                           - Prior to the procedure, a History and Physical                            was performed, and patient medications and                            allergies were reviewed. The patient's tolerance of                            previous anesthesia was also reviewed. The risks                            and benefits of the procedure and the sedation                            options and risks were discussed with the patient.                            All questions were answered, and informed consent                            was obtained. Prior Anticoagulants: The patient has                            taken no previous anticoagulant or antiplatelet                            agents. ASA Grade Assessment: II - A patient with                            mild systemic disease. After reviewing the risks  and benefits, the patient was deemed in                            satisfactory condition to undergo the procedure.                           After obtaining informed consent, the colonoscope                            was passed under direct vision. Throughout the                            procedure, the patient's blood pressure, pulse, and                            oxygen saturations  were monitored continuously. The                            Colonoscope was introduced through the anus and                            advanced to the 2 cm into the ileum. The                            colonoscopy was performed without difficulty. The                            patient tolerated the procedure well. The quality                            of the bowel preparation was good. Scope In: 1:37:08 PM Scope Out: 1:54:11 PM Scope Withdrawal Time: 0 hours 12 minutes 27 seconds  Total Procedure Duration: 0 hours 17 minutes 3 seconds  Findings:                 A few small-mouthed diverticula were found in the                            sigmoid colon, descending colon and ascending colon.                           An area of mild melanosis was found in the sigmoid                            colon and rectum.                           The terminal ileum appeared normal. Biopsies were                            taken with a cold forceps for histology. Estimated                            blood loss: none.  The exam was otherwise without abnormality on                            direct and retroflexion views. Complications:            No immediate complications. Estimated Blood Loss:     Estimated blood loss: none. Impression:               - Mild pancolonic diverticulosis.                           - Minimal melanosis in the left colon.                           - Otherwise normal colonoscopy to TI. Recommendation:           - Patient has a contact number available for                            emergencies. The signs and symptoms of potential                            delayed complications were discussed with the                            patient. Return to normal activities tomorrow.                            Written discharge instructions were provided to the                            patient.                           - Resume previous diet.                            - Continue present medications.                           - Await pathology results.                           - Repeat colonoscopy for surveillance based on                            pathology results.                           - Return to GI clinic in 12 weeks. Jackquline Denmark, MD 06/16/2018 2:01:08 PM This report has been signed electronically.

## 2018-06-16 NOTE — Progress Notes (Signed)
Report to PACU, RN, vss, BBS= Clear.  

## 2018-06-16 NOTE — Progress Notes (Signed)
Called to room to assist during endoscopic procedure.  Patient ID and intended procedure confirmed with present staff. Received instructions for my participation in the procedure from the performing physician.  

## 2018-06-16 NOTE — Patient Instructions (Addendum)
YOU HAD AN ENDOSCOPIC PROCEDURE TODAY AT [facility]:   Refer to the procedure report that was given to you for any specific questions about what was found during the examination.  If the procedure report does not answer your questions, please call your gastroenterologist to clarify.  If you requested that your care partner not be given the details of your procedure findings, then the procedure report has been included in a sealed envelope for you to review at your convenience later.  YOU SHOULD EXPECT: Some feelings of bloating in the abdomen. Passage of more gas than usual.  Walking can help get rid of the air that was put into your GI tract during the procedure and reduce the bloating. If you had a lower endoscopy (such as a colonoscopy or flexible sigmoidoscopy) you may notice spotting of blood in your stool or on the toilet paper. If you underwent a bowel prep for your procedure, you may not have a normal bowel movement for a few days.  Please Note:  You might notice some irritation and congestion in your nose or some drainage.  This is from the oxygen used during your procedure.  There is no need for concern and it should clear up in a day or so.  SYMPTOMS TO REPORT IMMEDIATELY:   Following lower endoscopy (colonoscopy or flexible sigmoidoscopy):  Excessive amounts of blood in the stool  Significant tenderness or worsening of abdominal pains  Swelling of the abdomen that is new, acute  Fever of 100F or higher   For urgent or emergent issues, a gastroenterologist can be reached at any hour by calling [phone].   DIET:  We do recommend a small meal at first, but then you may proceed to your regular diet.  Drink plenty of fluids but you should avoid alcoholic beverages for 24 hours. Do see if you have anything in your supplements which would cause you to have diarrhea. See note on your report sheet.  ACTIVITY:  You should plan to take it easy for the rest of today and  you should NOT DRIVE or use heavy machinery until tomorrow (because of the sedation medicines used during the test).    FOLLOW UP: Our staff will call the number listed on your records the next business day following your procedure to check on you and address any questions or concerns that you may have regarding the information given to you following your procedure. If we do not reach you, we will leave a message.  However, if you are feeling well and you are not experiencing any problems, there is no need to return our call.  We will assume that you have returned to your regular daily activities without incident.  If any biopsies were taken you will be contacted by phone or by letter within the next 1-3 weeks.  Please call us at [phone] if you have not heard about the biopsies in 3 weeks.    SIGNATURES/CONFIDENTIALITY: You and/or your care partner have signed paperwork which will be entered into your electronic medical record.  These signatures attest to the fact that that the information above on your After Visit Summary has been reviewed and is understood.  Full responsibility of the confidentiality of this discharge information lies with you and/or your care-partner.  Read all of the handouts given to you by your recovery room nurse.

## 2018-06-17 ENCOUNTER — Telehealth: Payer: Self-pay

## 2018-06-17 NOTE — Telephone Encounter (Signed)
  Follow up Call-  Call Maverick Patman number 06/16/2018  Post procedure Call Ryiah Bellissimo phone  # [phone]  Permission to leave phone message Yes  Some recent data might be hidden     Patient questions:  Do you have a fever, pain , or abdominal swelling? No. Pain Score  0 *  Have you tolerated food without any problems? Yes.    Have you been able to return to your normal activities? Yes.    Do you have any questions about your discharge instructions: Diet   No. Medications  No. Follow up visit  No.  Do you have questions or concerns about your Care? No.  Actions: * If pain score is 4 or above: No action needed, pain <4.

## 2018-06-22 ENCOUNTER — Encounter: Payer: Self-pay | Admitting: Gastroenterology

## 2018-08-20 ENCOUNTER — Other Ambulatory Visit: Payer: Self-pay | Admitting: Gastroenterology

## 2018-08-21 ENCOUNTER — Encounter: Payer: Self-pay | Admitting: Obstetrics & Gynecology

## 2018-08-21 ENCOUNTER — Ambulatory Visit: Payer: PPO | Admitting: Obstetrics & Gynecology

## 2018-08-21 VITALS — BP 140/70 | Ht 63.5 in | Wt 127.0 lb

## 2018-08-21 DIAGNOSIS — Z780 Asymptomatic menopausal state: Secondary | ICD-10-CM

## 2018-08-21 DIAGNOSIS — Z01419 Encounter for gynecological examination (general) (routine) without abnormal findings: Secondary | ICD-10-CM

## 2018-08-21 DIAGNOSIS — M8589 Other specified disorders of bone density and structure, multiple sites: Secondary | ICD-10-CM

## 2018-08-21 DIAGNOSIS — N952 Postmenopausal atrophic vaginitis: Secondary | ICD-10-CM

## 2018-08-21 NOTE — Progress Notes (Signed)
Kerry Lewis October 05, 1951 [id number]   History:    67 y.o. G3P2A1L2 Married  RP:  Established patient presenting for annual gyn exam   HPI: Menopause, well on no HRT.  No PMB. No pelvic pain.  Vaginal dryness/pain with IC.  Urine normal.  BM IBS/Celiac disease.  Breasts wnl.  BMI 22.14.  Fitness 6 x per week.  Health Labs with Fam MD.  Past medical history,surgical history, family history and social history were all reviewed and documented in the EPIC chart.  Gynecologic History No LMP recorded. Patient is postmenopausal. Contraception: post menopausal status Last Pap: 12/2015. Results were: Negative Last mammogram: 04/2018. Results were: Negative Bone Density: 07/2016 Osteopenia, referral to [facility] to repeat a BD now. Colonoscopy: 2019 negative  Obstetric History OB History  Gravida Para Term Preterm AB Living  3 2     1 2   SAB TAB Ectopic Multiple Live Births  1            # Outcome Date GA Lbr Len/2nd Weight Sex Delivery Anes PTL Lv  3 SAB           2 Para           1 Para              ROS: A ROS was performed and pertinent positives and negatives are included in the history.  GENERAL: No fevers or chills. HEENT: No change in vision, no earache, sore throat or sinus congestion. NECK: No pain or stiffness. CARDIOVASCULAR: No chest pain or pressure. No palpitations. PULMONARY: No shortness of breath, cough or wheeze. GASTROINTESTINAL: No abdominal pain, nausea, vomiting or diarrhea, melena or bright red blood per rectum. GENITOURINARY: No urinary frequency, urgency, hesitancy or dysuria. MUSCULOSKELETAL: No joint or muscle pain, no back pain, no recent trauma. DERMATOLOGIC: No rash, no itching, no lesions. ENDOCRINE: No polyuria, polydipsia, no heat or cold intolerance. No recent change in weight. HEMATOLOGICAL: No anemia or easy bruising or bleeding. NEUROLOGIC: No headache, seizures, numbness, tingling or weakness. PSYCHIATRIC: No depression, no loss of interest in  normal activity or change in sleep pattern.     Exam:   BP 140/70   Ht 5' 3.5" (1.613 m)   Wt 127 lb (57.6 kg)   BMI 22.14 kg/m   Body mass index is 22.14 kg/m.  General appearance : Well developed well nourished female. No acute distress HEENT: Eyes: no retinal hemorrhage or exudates,  Neck supple, trachea midline, no carotid bruits, no thyroidmegaly Lungs: Clear to auscultation, no rhonchi or wheezes, or rib retractions  Heart: Regular rate and rhythm, no murmurs or gallops Breast:Examined in sitting and supine position were symmetrical in appearance, no palpable masses or tenderness,  no skin retraction, no nipple inversion, no nipple discharge, no skin discoloration, no axillary or supraclavicular lymphadenopathy Abdomen: no palpable masses or tenderness, no rebound or guarding Extremities: no edema or skin discoloration or tenderness  Pelvic: Vulva: Normal             Vagina: No gross lesions or discharge  Cervix: No gross lesions or discharge.  Pap reflex done  Uterus  AV, normal size, shape and consistency, non-tender and mobile  Adnexa  Without masses or tenderness  Anus: Normal  Assessment/Plan:  67 y.o. female for annual exam   1. Encounter for routine gynecological examination with Papanicolaou smear of cervix Normal gynecologic exam.  Pap reflex done.  Breast exam normal.  Last mammogram June 2019 was negative.  Colonoscopy  negative in 2019.  Health labs with family physician.  2. Postmenopausal Well on no hormone replacement therapy.  No postmenopausal bleeding.  3. Post-menopausal atrophic vaginitis Patient prefers not to start on vaginal estrogen.  Recommend trying sexual activity with Coconut oil.  4. Osteopenia of multiple sites Last bone density in September 2017 at the breast center showed osteopenia at multiple sites.  Referral for repeat BD now at the [facility].  Vitamin D supplements, calcium rich nutrition and regular weightbearing physical  activity recommended.  Princess Bruins MD, 2:13 PM 08/21/2018

## 2018-08-24 ENCOUNTER — Encounter: Payer: Self-pay | Admitting: Obstetrics & Gynecology

## 2018-08-24 NOTE — Patient Instructions (Signed)
1. Encounter for routine gynecological examination with Papanicolaou smear of cervix Normal gynecologic exam.  Pap reflex done.  Breast exam normal.  Last mammogram June 2019 was negative.  Colonoscopy negative in 2019.  Health labs with family physician.  2. Postmenopausal Well on no hormone replacement therapy.  No postmenopausal bleeding.  3. Post-menopausal atrophic vaginitis Patient prefers not to start on vaginal estrogen.  Recommend trying sexual activity with Coconut oil.  4. Osteopenia of multiple sites Last bone density in September 2017 at the breast center showed osteopenia at multiple sites.  Referral for repeat BD now at the [facility].  Vitamin D supplements, calcium rich nutrition and regular weightbearing physical activity recommended.  [facility]ina, it was a pleasure seeing you today!  I will inform you of your results as soon as they are available.

## 2018-08-25 LAB — PAP IG W/ RFLX HPV ASCU

## 2018-08-26 ENCOUNTER — Other Ambulatory Visit: Payer: Self-pay | Admitting: Obstetrics & Gynecology

## 2018-08-26 DIAGNOSIS — L814 Other melanin hyperpigmentation: Secondary | ICD-10-CM | POA: Diagnosis not present

## 2018-08-26 DIAGNOSIS — L988 Other specified disorders of the skin and subcutaneous tissue: Secondary | ICD-10-CM | POA: Diagnosis not present

## 2018-08-26 DIAGNOSIS — Z85828 Personal history of other malignant neoplasm of skin: Secondary | ICD-10-CM | POA: Diagnosis not present

## 2018-08-26 DIAGNOSIS — M8580 Other specified disorders of bone density and structure, unspecified site: Secondary | ICD-10-CM

## 2018-08-26 DIAGNOSIS — D225 Melanocytic nevi of trunk: Secondary | ICD-10-CM | POA: Diagnosis not present

## 2018-08-26 DIAGNOSIS — L821 Other seborrheic keratosis: Secondary | ICD-10-CM | POA: Diagnosis not present

## 2018-08-26 DIAGNOSIS — D1801 Hemangioma of skin and subcutaneous tissue: Secondary | ICD-10-CM | POA: Diagnosis not present

## 2018-09-04 DIAGNOSIS — L851 Acquired keratosis [keratoderma] palmaris et plantaris: Secondary | ICD-10-CM | POA: Diagnosis not present

## 2018-09-04 DIAGNOSIS — M216X1 Other acquired deformities of right foot: Secondary | ICD-10-CM | POA: Diagnosis not present

## 2018-09-04 DIAGNOSIS — M216X2 Other acquired deformities of left foot: Secondary | ICD-10-CM | POA: Diagnosis not present

## 2018-09-05 ENCOUNTER — Telehealth: Payer: Self-pay

## 2018-09-05 MED ORDER — HYOSCYAMINE SULFATE 0.125 MG PO TABS: ORAL_TABLET | ORAL | 6 refills | Status: DC

## 2018-09-05 NOTE — Telephone Encounter (Signed)
Sent refill for hyoscyamine 0.19m SL 1-2 tabs every 4-6 hours as needed, order given per Dr. GLyndel Safe

## 2018-09-08 DIAGNOSIS — Z6822 Body mass index (BMI) 22.0-22.9, adult: Secondary | ICD-10-CM | POA: Diagnosis not present

## 2018-09-08 DIAGNOSIS — N390 Urinary tract infection, site not specified: Secondary | ICD-10-CM | POA: Diagnosis not present

## 2018-09-08 DIAGNOSIS — L658 Other specified nonscarring hair loss: Secondary | ICD-10-CM | POA: Diagnosis not present

## 2018-09-08 DIAGNOSIS — L650 Telogen effluvium: Secondary | ICD-10-CM | POA: Diagnosis not present

## 2018-09-08 DIAGNOSIS — L661 Lichen planopilaris: Secondary | ICD-10-CM | POA: Diagnosis not present

## 2018-09-08 DIAGNOSIS — L659 Nonscarring hair loss, unspecified: Secondary | ICD-10-CM | POA: Diagnosis not present

## 2018-09-08 DIAGNOSIS — Z79899 Other long term (current) drug therapy: Secondary | ICD-10-CM | POA: Diagnosis not present

## 2018-09-08 DIAGNOSIS — R300 Dysuria: Secondary | ICD-10-CM | POA: Diagnosis not present

## 2018-09-10 ENCOUNTER — Ambulatory Visit
Admission: RE | Admit: 2018-09-10 | Discharge: 2018-09-10 | Disposition: A | Discharge status: Home / Self Care | Payer: PPO | Source: Ambulatory Visit | Attending: Obstetrics & Gynecology | Admitting: Obstetrics & Gynecology

## 2018-09-10 DIAGNOSIS — Z780 Asymptomatic menopausal state: Secondary | ICD-10-CM | POA: Diagnosis not present

## 2018-09-10 DIAGNOSIS — M8580 Other specified disorders of bone density and structure, unspecified site: Secondary | ICD-10-CM

## 2018-09-10 DIAGNOSIS — M85852 Other specified disorders of bone density and structure, left thigh: Secondary | ICD-10-CM | POA: Diagnosis not present

## 2018-09-11 DIAGNOSIS — Z139 Encounter for screening, unspecified: Secondary | ICD-10-CM | POA: Diagnosis not present

## 2018-09-11 DIAGNOSIS — Z1339 Encounter for screening examination for other mental health and behavioral disorders: Secondary | ICD-10-CM | POA: Diagnosis not present

## 2018-09-11 DIAGNOSIS — E079 Disorder of thyroid, unspecified: Secondary | ICD-10-CM | POA: Diagnosis not present

## 2018-09-11 DIAGNOSIS — Z6821 Body mass index (BMI) 21.0-21.9, adult: Secondary | ICD-10-CM | POA: Diagnosis not present

## 2018-09-11 DIAGNOSIS — Z0000 Encounter for general adult medical examination without abnormal findings: Secondary | ICD-10-CM | POA: Diagnosis not present

## 2018-09-11 DIAGNOSIS — E559 Vitamin D deficiency, unspecified: Secondary | ICD-10-CM | POA: Diagnosis not present

## 2018-09-11 DIAGNOSIS — E785 Hyperlipidemia, unspecified: Secondary | ICD-10-CM | POA: Diagnosis not present

## 2018-09-17 DIAGNOSIS — L658 Other specified nonscarring hair loss: Secondary | ICD-10-CM | POA: Diagnosis not present

## 2018-09-17 DIAGNOSIS — L661 Lichen planopilaris: Secondary | ICD-10-CM | POA: Diagnosis not present

## 2018-09-17 DIAGNOSIS — Z79899 Other long term (current) drug therapy: Secondary | ICD-10-CM | POA: Diagnosis not present

## 2018-09-30 DIAGNOSIS — Z6822 Body mass index (BMI) 22.0-22.9, adult: Secondary | ICD-10-CM | POA: Diagnosis not present

## 2018-09-30 DIAGNOSIS — R300 Dysuria: Secondary | ICD-10-CM | POA: Diagnosis not present

## 2018-09-30 DIAGNOSIS — L661 Lichen planopilaris: Secondary | ICD-10-CM | POA: Diagnosis not present

## 2018-10-07 DIAGNOSIS — R109 Unspecified abdominal pain: Secondary | ICD-10-CM | POA: Diagnosis not present

## 2018-10-07 DIAGNOSIS — N281 Cyst of kidney, acquired: Secondary | ICD-10-CM | POA: Diagnosis not present

## 2018-10-20 ENCOUNTER — Ambulatory Visit: Payer: PPO | Admitting: Obstetrics & Gynecology

## 2018-10-24 DIAGNOSIS — R310 Gross hematuria: Secondary | ICD-10-CM | POA: Diagnosis not present

## 2018-10-24 DIAGNOSIS — N3289 Other specified disorders of bladder: Secondary | ICD-10-CM | POA: Diagnosis not present

## 2018-10-27 DIAGNOSIS — R310 Gross hematuria: Secondary | ICD-10-CM | POA: Diagnosis not present

## 2018-11-12 ENCOUNTER — Telehealth: Payer: Self-pay | Admitting: Gastroenterology

## 2018-11-12 MED ORDER — PANTOPRAZOLE SODIUM 40 MG PO TBEC: 40.0000 mg | DELAYED_RELEASE_TABLET | Freq: Every day | ORAL | 6 refills | Status: DC

## 2018-11-12 NOTE — Telephone Encounter (Signed)
Set refill to patients pharmacy.

## 2018-11-12 NOTE — Telephone Encounter (Signed)
Patient states she needs a refill of medication pantoprazole sent to [facility].

## 2018-11-21 DIAGNOSIS — L658 Other specified nonscarring hair loss: Secondary | ICD-10-CM | POA: Diagnosis not present

## 2018-11-21 DIAGNOSIS — L661 Lichen planopilaris: Secondary | ICD-10-CM | POA: Diagnosis not present

## 2018-11-21 DIAGNOSIS — Z79899 Other long term (current) drug therapy: Secondary | ICD-10-CM | POA: Diagnosis not present

## 2018-11-26 DIAGNOSIS — R310 Gross hematuria: Secondary | ICD-10-CM | POA: Diagnosis not present

## 2018-11-26 DIAGNOSIS — N3289 Other specified disorders of bladder: Secondary | ICD-10-CM | POA: Diagnosis not present

## 2018-12-05 DIAGNOSIS — M545 Low back pain: Secondary | ICD-10-CM | POA: Diagnosis not present

## 2018-12-05 DIAGNOSIS — Z79899 Other long term (current) drug therapy: Secondary | ICD-10-CM | POA: Diagnosis not present

## 2018-12-05 DIAGNOSIS — Z6822 Body mass index (BMI) 22.0-22.9, adult: Secondary | ICD-10-CM | POA: Diagnosis not present

## 2018-12-05 DIAGNOSIS — K580 Irritable bowel syndrome with diarrhea: Secondary | ICD-10-CM | POA: Diagnosis not present

## 2018-12-12 DIAGNOSIS — M545 Low back pain: Secondary | ICD-10-CM | POA: Diagnosis not present

## 2018-12-12 DIAGNOSIS — M62551 Muscle wasting and atrophy, not elsewhere classified, right thigh: Secondary | ICD-10-CM | POA: Diagnosis not present

## 2018-12-12 DIAGNOSIS — M62552 Muscle wasting and atrophy, not elsewhere classified, left thigh: Secondary | ICD-10-CM | POA: Diagnosis not present

## 2018-12-15 DIAGNOSIS — M62552 Muscle wasting and atrophy, not elsewhere classified, left thigh: Secondary | ICD-10-CM | POA: Diagnosis not present

## 2018-12-15 DIAGNOSIS — M545 Low back pain: Secondary | ICD-10-CM | POA: Diagnosis not present

## 2018-12-15 DIAGNOSIS — M62551 Muscle wasting and atrophy, not elsewhere classified, right thigh: Secondary | ICD-10-CM | POA: Diagnosis not present

## 2018-12-22 DIAGNOSIS — Z79899 Other long term (current) drug therapy: Secondary | ICD-10-CM | POA: Diagnosis not present

## 2018-12-22 DIAGNOSIS — M62552 Muscle wasting and atrophy, not elsewhere classified, left thigh: Secondary | ICD-10-CM | POA: Diagnosis not present

## 2018-12-22 DIAGNOSIS — I659 Occlusion and stenosis of unspecified precerebral artery: Secondary | ICD-10-CM | POA: Diagnosis not present

## 2018-12-22 DIAGNOSIS — M62551 Muscle wasting and atrophy, not elsewhere classified, right thigh: Secondary | ICD-10-CM | POA: Diagnosis not present

## 2018-12-22 DIAGNOSIS — M545 Low back pain: Secondary | ICD-10-CM | POA: Diagnosis not present

## 2018-12-29 DIAGNOSIS — M545 Low back pain: Secondary | ICD-10-CM | POA: Diagnosis not present

## 2018-12-29 DIAGNOSIS — M62552 Muscle wasting and atrophy, not elsewhere classified, left thigh: Secondary | ICD-10-CM | POA: Diagnosis not present

## 2018-12-29 DIAGNOSIS — M62551 Muscle wasting and atrophy, not elsewhere classified, right thigh: Secondary | ICD-10-CM | POA: Diagnosis not present

## 2019-01-01 DIAGNOSIS — Z0000 Encounter for general adult medical examination without abnormal findings: Secondary | ICD-10-CM | POA: Diagnosis not present

## 2019-01-01 DIAGNOSIS — Z136 Encounter for screening for cardiovascular disorders: Secondary | ICD-10-CM | POA: Diagnosis not present

## 2019-01-01 DIAGNOSIS — Z9181 History of falling: Secondary | ICD-10-CM | POA: Diagnosis not present

## 2019-01-01 DIAGNOSIS — N959 Unspecified menopausal and perimenopausal disorder: Secondary | ICD-10-CM | POA: Diagnosis not present

## 2019-01-01 DIAGNOSIS — Z1211 Encounter for screening for malignant neoplasm of colon: Secondary | ICD-10-CM | POA: Diagnosis not present

## 2019-01-01 DIAGNOSIS — E785 Hyperlipidemia, unspecified: Secondary | ICD-10-CM | POA: Diagnosis not present

## 2019-01-01 DIAGNOSIS — Z1231 Encounter for screening mammogram for malignant neoplasm of breast: Secondary | ICD-10-CM | POA: Diagnosis not present

## 2019-01-01 DIAGNOSIS — Z1331 Encounter for screening for depression: Secondary | ICD-10-CM | POA: Diagnosis not present

## 2019-01-02 DIAGNOSIS — M62552 Muscle wasting and atrophy, not elsewhere classified, left thigh: Secondary | ICD-10-CM | POA: Diagnosis not present

## 2019-01-02 DIAGNOSIS — M62551 Muscle wasting and atrophy, not elsewhere classified, right thigh: Secondary | ICD-10-CM | POA: Diagnosis not present

## 2019-01-02 DIAGNOSIS — M545 Low back pain: Secondary | ICD-10-CM | POA: Diagnosis not present

## 2019-01-05 DIAGNOSIS — L659 Nonscarring hair loss, unspecified: Secondary | ICD-10-CM | POA: Diagnosis not present

## 2019-01-05 DIAGNOSIS — Z5181 Encounter for therapeutic drug level monitoring: Secondary | ICD-10-CM | POA: Diagnosis not present

## 2019-01-05 DIAGNOSIS — L661 Lichen planopilaris: Secondary | ICD-10-CM | POA: Diagnosis not present

## 2019-01-05 DIAGNOSIS — L089 Local infection of the skin and subcutaneous tissue, unspecified: Secondary | ICD-10-CM | POA: Diagnosis not present

## 2019-01-05 DIAGNOSIS — L658 Other specified nonscarring hair loss: Secondary | ICD-10-CM | POA: Diagnosis not present

## 2019-01-05 DIAGNOSIS — Z79899 Other long term (current) drug therapy: Secondary | ICD-10-CM | POA: Diagnosis not present

## 2019-01-08 DIAGNOSIS — M545 Low back pain: Secondary | ICD-10-CM | POA: Diagnosis not present

## 2019-01-08 DIAGNOSIS — M62551 Muscle wasting and atrophy, not elsewhere classified, right thigh: Secondary | ICD-10-CM | POA: Diagnosis not present

## 2019-01-08 DIAGNOSIS — M62552 Muscle wasting and atrophy, not elsewhere classified, left thigh: Secondary | ICD-10-CM | POA: Diagnosis not present

## 2019-01-15 DIAGNOSIS — M545 Low back pain: Secondary | ICD-10-CM | POA: Diagnosis not present

## 2019-01-15 DIAGNOSIS — M62552 Muscle wasting and atrophy, not elsewhere classified, left thigh: Secondary | ICD-10-CM | POA: Diagnosis not present

## 2019-01-15 DIAGNOSIS — M62551 Muscle wasting and atrophy, not elsewhere classified, right thigh: Secondary | ICD-10-CM | POA: Diagnosis not present

## 2019-03-03 DIAGNOSIS — M25561 Pain in right knee: Secondary | ICD-10-CM | POA: Insufficient documentation

## 2019-03-03 HISTORY — DX: Pain in right knee: M25.561

## 2019-03-18 ENCOUNTER — Other Ambulatory Visit: Payer: Self-pay

## 2019-03-18 MED ORDER — DIPHENOXYLATE-ATROPINE 2.5-0.025 MG PO TABS: 1.0000 | ORAL_TABLET | Freq: Two times a day (BID) | ORAL | 0 refills | Status: DC

## 2019-04-14 DIAGNOSIS — R102 Pelvic and perineal pain: Secondary | ICD-10-CM | POA: Diagnosis not present

## 2019-04-14 DIAGNOSIS — G8929 Other chronic pain: Secondary | ICD-10-CM | POA: Diagnosis not present

## 2019-04-14 DIAGNOSIS — M545 Low back pain: Secondary | ICD-10-CM | POA: Diagnosis not present

## 2019-04-20 DIAGNOSIS — M545 Low back pain: Secondary | ICD-10-CM | POA: Diagnosis not present

## 2019-04-20 DIAGNOSIS — M533 Sacrococcygeal disorders, not elsewhere classified: Secondary | ICD-10-CM | POA: Diagnosis not present

## 2019-05-11 DIAGNOSIS — L658 Other specified nonscarring hair loss: Secondary | ICD-10-CM | POA: Diagnosis not present

## 2019-05-11 DIAGNOSIS — L814 Other melanin hyperpigmentation: Secondary | ICD-10-CM | POA: Diagnosis not present

## 2019-05-11 DIAGNOSIS — L570 Actinic keratosis: Secondary | ICD-10-CM | POA: Diagnosis not present

## 2019-05-11 DIAGNOSIS — L578 Other skin changes due to chronic exposure to nonionizing radiation: Secondary | ICD-10-CM | POA: Diagnosis not present

## 2019-05-11 DIAGNOSIS — D225 Melanocytic nevi of trunk: Secondary | ICD-10-CM | POA: Diagnosis not present

## 2019-05-11 DIAGNOSIS — Z85828 Personal history of other malignant neoplasm of skin: Secondary | ICD-10-CM | POA: Diagnosis not present

## 2019-05-11 DIAGNOSIS — L821 Other seborrheic keratosis: Secondary | ICD-10-CM | POA: Diagnosis not present

## 2019-05-11 DIAGNOSIS — D1801 Hemangioma of skin and subcutaneous tissue: Secondary | ICD-10-CM | POA: Diagnosis not present

## 2019-05-11 DIAGNOSIS — L661 Lichen planopilaris: Secondary | ICD-10-CM | POA: Diagnosis not present

## 2019-05-14 ENCOUNTER — Other Ambulatory Visit: Payer: Self-pay | Admitting: Obstetrics & Gynecology

## 2019-05-14 DIAGNOSIS — Z1231 Encounter for screening mammogram for malignant neoplasm of breast: Secondary | ICD-10-CM

## 2019-05-18 DIAGNOSIS — L658 Other specified nonscarring hair loss: Secondary | ICD-10-CM | POA: Diagnosis not present

## 2019-05-18 DIAGNOSIS — Z79899 Other long term (current) drug therapy: Secondary | ICD-10-CM | POA: Diagnosis not present

## 2019-05-18 DIAGNOSIS — L089 Local infection of the skin and subcutaneous tissue, unspecified: Secondary | ICD-10-CM | POA: Diagnosis not present

## 2019-05-18 DIAGNOSIS — L661 Lichen planopilaris: Secondary | ICD-10-CM | POA: Diagnosis not present

## 2019-05-18 DIAGNOSIS — Z5181 Encounter for therapeutic drug level monitoring: Secondary | ICD-10-CM | POA: Diagnosis not present

## 2019-06-05 DIAGNOSIS — M533 Sacrococcygeal disorders, not elsewhere classified: Secondary | ICD-10-CM | POA: Diagnosis not present

## 2019-06-08 DIAGNOSIS — L659 Nonscarring hair loss, unspecified: Secondary | ICD-10-CM | POA: Diagnosis not present

## 2019-06-08 DIAGNOSIS — Z79899 Other long term (current) drug therapy: Secondary | ICD-10-CM | POA: Diagnosis not present

## 2019-06-25 ENCOUNTER — Ambulatory Visit
Admission: RE | Admit: 2019-06-25 | Discharge: 2019-06-25 | Disposition: A | Discharge status: Home / Self Care | Payer: PPO | Source: Ambulatory Visit | Attending: Obstetrics & Gynecology | Admitting: Obstetrics & Gynecology

## 2019-06-25 ENCOUNTER — Other Ambulatory Visit: Payer: Self-pay

## 2019-06-25 DIAGNOSIS — Z1231 Encounter for screening mammogram for malignant neoplasm of breast: Secondary | ICD-10-CM

## 2019-07-02 ENCOUNTER — Other Ambulatory Visit: Payer: Self-pay | Admitting: Gastroenterology

## 2019-07-30 DIAGNOSIS — L851 Acquired keratosis [keratoderma] palmaris et plantaris: Secondary | ICD-10-CM | POA: Diagnosis not present

## 2019-07-30 DIAGNOSIS — M216X2 Other acquired deformities of left foot: Secondary | ICD-10-CM | POA: Diagnosis not present

## 2019-07-30 DIAGNOSIS — M216X1 Other acquired deformities of right foot: Secondary | ICD-10-CM | POA: Diagnosis not present

## 2019-08-12 ENCOUNTER — Encounter: Payer: Self-pay | Admitting: Gynecology

## 2019-08-24 ENCOUNTER — Other Ambulatory Visit: Payer: Self-pay

## 2019-08-25 ENCOUNTER — Encounter: Payer: Self-pay | Admitting: Obstetrics & Gynecology

## 2019-08-25 ENCOUNTER — Ambulatory Visit (INDEPENDENT_AMBULATORY_CARE_PROVIDER_SITE_OTHER): Payer: PPO | Admitting: Obstetrics & Gynecology

## 2019-08-25 VITALS — BP 116/72 | Ht 63.0 in | Wt 125.0 lb

## 2019-08-25 DIAGNOSIS — Z01419 Encounter for gynecological examination (general) (routine) without abnormal findings: Secondary | ICD-10-CM

## 2019-08-25 DIAGNOSIS — Z780 Asymptomatic menopausal state: Secondary | ICD-10-CM

## 2019-08-25 DIAGNOSIS — M8589 Other specified disorders of bone density and structure, multiple sites: Secondary | ICD-10-CM

## 2019-08-25 NOTE — Patient Instructions (Signed)
1. Well female exam with routine gynecological exam Normal gynecologic exam.  Pap test October 2019 was negative, no indication to repeat this year.  Breast exam normal.  Screening mammogram August 2020 was negative.  Colonoscopy in 2019.  Good body mass index at 22.14.  Continue with fitness and healthy nutrition.  Health labs with family physician.  2. Postmenopausal Well on no hormone replacement therapy.  No postmenopausal bleeding.  3. Osteopenia of multiple sites Osteopenia on bone density in November 2019.  Continue with vitamin D supplements, calcium intake of 1200 mg daily and regular weightbearing physical activities.  Other orders - MAGNESIUM-CALCIUM-FOLIC ACID PO; Take by mouth.  Kerry Lewis, it was a pleasure seeing you today!

## 2019-08-25 NOTE — Progress Notes (Signed)
Kerry Lewis 1950-11-11 [id number]   History:    68 y.o. G3P2A1L2 Married  RP:  Established patient presenting for annual gyn exam   HPI: Postmenopause, well on no HRT.  No PMB.  No pelvic pain.  Sexually active, but no IC because of vaginal dryness/pain with IC.  Urine/BMs normal.  Breasts normal.  BMI 22.14.  Very good fitness, healthy nutrition.  Health labs with Fam MD.  Past medical history,surgical history, family history and social history were all reviewed and documented in the EPIC chart.  Gynecologic History No LMP recorded. Patient is postmenopausal. Contraception: post menopausal status Last Pap: 08/2018. Results were: Negative Last mammogram: 06/2019. Results were: Negative Bone Density: 09/2018 Osteopenia Colonoscopy: 2019  Obstetric History OB History  Gravida Para Term Preterm AB Living  3 2     1 2   SAB TAB Ectopic Multiple Live Births  1            # Outcome Date GA Lbr Len/2nd Weight Sex Delivery Anes PTL Lv  3 SAB           2 Para           1 Para              ROS: A ROS was performed and pertinent positives and negatives are included in the history.  GENERAL: No fevers or chills. HEENT: No change in vision, no earache, sore throat or sinus congestion. NECK: No pain or stiffness. CARDIOVASCULAR: No chest pain or pressure. No palpitations. PULMONARY: No shortness of breath, cough or wheeze. GASTROINTESTINAL: No abdominal pain, nausea, vomiting or diarrhea, melena or bright red blood per rectum. GENITOURINARY: No urinary frequency, urgency, hesitancy or dysuria. MUSCULOSKELETAL: No joint or muscle pain, no back pain, no recent trauma. DERMATOLOGIC: No rash, no itching, no lesions. ENDOCRINE: No polyuria, polydipsia, no heat or cold intolerance. No recent change in weight. HEMATOLOGICAL: No anemia or easy bruising or bleeding. NEUROLOGIC: No headache, seizures, numbness, tingling or weakness. PSYCHIATRIC: No depression, no loss of interest in normal  activity or change in sleep pattern.     Exam:   BP 116/72    Ht 5' 3"  (1.6 m)    Wt 125 lb (56.7 kg)    BMI 22.14 kg/m   Body mass index is 22.14 kg/m.  General appearance : Well developed well nourished female. No acute distress HEENT: Eyes: no retinal hemorrhage or exudates,  Neck supple, trachea midline, no carotid bruits, no thyroidmegaly Lungs: Clear to auscultation, no rhonchi or wheezes, or rib retractions  Heart: Regular rate and rhythm, no murmurs or gallops Breast:Examined in sitting and supine position were symmetrical in appearance, no palpable masses or tenderness,  no skin retraction, no nipple inversion, no nipple discharge, no skin discoloration, no axillary or supraclavicular lymphadenopathy Abdomen: no palpable masses or tenderness, no rebound or guarding Extremities: no edema or skin discoloration or tenderness  Pelvic: Vulva: Normal             Vagina: No gross lesions or discharge  Cervix: No gross lesions or discharge  Uterus  AV, normal size, shape and consistency, non-tender and mobile  Adnexa  Without masses or tenderness  Anus: Normal   Assessment/Plan:  68 y.o. female for annual exam   1. Well female exam with routine gynecological exam Normal gynecologic exam.  Pap test October 2019 was negative, no indication to repeat this year.  Breast exam normal.  Screening mammogram August 2020 was negative.  Colonoscopy  in 2019.  Good body mass index at 22.14.  Continue with fitness and healthy nutrition.  Health labs with family physician.  2. Postmenopausal Well on no hormone replacement therapy.  No postmenopausal bleeding.  3. Osteopenia of multiple sites Osteopenia on bone density in November 2019.  Continue with vitamin D supplements, calcium intake of 1200 mg daily and regular weightbearing physical activities.  Other orders - MAGNESIUM-CALCIUM-FOLIC ACID PO; Take by mouth.  Princess Bruins MD, 3:27 PM 08/25/2019

## 2019-08-27 ENCOUNTER — Telehealth: Payer: Self-pay | Admitting: Gastroenterology

## 2019-08-27 NOTE — Telephone Encounter (Signed)
Please advise 

## 2019-08-27 NOTE — Telephone Encounter (Signed)
Pt called in and said that when she had her colonoscopy he gave her some samples of Viberzi and she said that she got one refill but now it has ran out and it is over $900 to refill. She is asking for something cheaper that will help her.

## 2019-09-01 NOTE — Telephone Encounter (Signed)
Please arrange for samples of Viberzi Can try Lomotil 1 tablet p.o. twice daily, 60, 2 refills Thx  RG

## 2019-09-02 MED ORDER — DIPHENOXYLATE-ATROPINE 2.5-0.025 MG PO TABS: 1.0000 | ORAL_TABLET | Freq: Two times a day (BID) | ORAL | 2 refills | Status: DC

## 2019-09-02 NOTE — Telephone Encounter (Signed)
Called patient and left voicemail to call back.   Patient can pick up samples of the Viberzi at the [facility]. They are waiting on her

## 2019-09-02 NOTE — Telephone Encounter (Signed)
I have sent the patients prescription to pharmacy.

## 2019-09-15 DIAGNOSIS — K580 Irritable bowel syndrome with diarrhea: Secondary | ICD-10-CM | POA: Diagnosis not present

## 2019-09-15 DIAGNOSIS — E559 Vitamin D deficiency, unspecified: Secondary | ICD-10-CM | POA: Diagnosis not present

## 2019-09-15 DIAGNOSIS — Z6821 Body mass index (BMI) 21.0-21.9, adult: Secondary | ICD-10-CM | POA: Diagnosis not present

## 2019-09-15 DIAGNOSIS — K219 Gastro-esophageal reflux disease without esophagitis: Secondary | ICD-10-CM | POA: Diagnosis not present

## 2019-09-15 DIAGNOSIS — E785 Hyperlipidemia, unspecified: Secondary | ICD-10-CM | POA: Diagnosis not present

## 2019-09-15 DIAGNOSIS — F5104 Psychophysiologic insomnia: Secondary | ICD-10-CM | POA: Diagnosis not present

## 2019-09-15 DIAGNOSIS — K222 Esophageal obstruction: Secondary | ICD-10-CM | POA: Diagnosis not present

## 2019-09-15 DIAGNOSIS — E079 Disorder of thyroid, unspecified: Secondary | ICD-10-CM | POA: Diagnosis not present

## 2019-10-06 DIAGNOSIS — R300 Dysuria: Secondary | ICD-10-CM | POA: Diagnosis not present

## 2019-10-12 DIAGNOSIS — N390 Urinary tract infection, site not specified: Secondary | ICD-10-CM | POA: Diagnosis not present

## 2019-11-04 DIAGNOSIS — E531 Pyridoxine deficiency: Secondary | ICD-10-CM | POA: Diagnosis not present

## 2019-11-04 DIAGNOSIS — Z5181 Encounter for therapeutic drug level monitoring: Secondary | ICD-10-CM | POA: Diagnosis not present

## 2019-11-09 DIAGNOSIS — L570 Actinic keratosis: Secondary | ICD-10-CM | POA: Diagnosis not present

## 2019-11-09 DIAGNOSIS — L988 Other specified disorders of the skin and subcutaneous tissue: Secondary | ICD-10-CM | POA: Diagnosis not present

## 2019-11-09 DIAGNOSIS — L814 Other melanin hyperpigmentation: Secondary | ICD-10-CM | POA: Diagnosis not present

## 2019-11-09 DIAGNOSIS — L821 Other seborrheic keratosis: Secondary | ICD-10-CM | POA: Diagnosis not present

## 2019-11-09 DIAGNOSIS — D1801 Hemangioma of skin and subcutaneous tissue: Secondary | ICD-10-CM | POA: Diagnosis not present

## 2019-11-09 DIAGNOSIS — D225 Melanocytic nevi of trunk: Secondary | ICD-10-CM | POA: Diagnosis not present

## 2019-11-09 DIAGNOSIS — Z85828 Personal history of other malignant neoplasm of skin: Secondary | ICD-10-CM | POA: Diagnosis not present

## 2019-11-16 DIAGNOSIS — Z5181 Encounter for therapeutic drug level monitoring: Secondary | ICD-10-CM | POA: Diagnosis not present

## 2019-11-16 DIAGNOSIS — L089 Local infection of the skin and subcutaneous tissue, unspecified: Secondary | ICD-10-CM | POA: Diagnosis not present

## 2019-11-16 DIAGNOSIS — L658 Other specified nonscarring hair loss: Secondary | ICD-10-CM | POA: Diagnosis not present

## 2019-11-16 DIAGNOSIS — Z79899 Other long term (current) drug therapy: Secondary | ICD-10-CM | POA: Diagnosis not present

## 2019-11-16 DIAGNOSIS — L661 Lichen planopilaris: Secondary | ICD-10-CM | POA: Diagnosis not present

## 2019-12-07 DIAGNOSIS — H26492 Other secondary cataract, left eye: Secondary | ICD-10-CM | POA: Diagnosis not present

## 2019-12-07 DIAGNOSIS — H35371 Puckering of macula, right eye: Secondary | ICD-10-CM | POA: Diagnosis not present

## 2019-12-07 DIAGNOSIS — Z79899 Other long term (current) drug therapy: Secondary | ICD-10-CM | POA: Diagnosis not present

## 2019-12-15 DIAGNOSIS — L570 Actinic keratosis: Secondary | ICD-10-CM | POA: Diagnosis not present

## 2020-01-04 ENCOUNTER — Other Ambulatory Visit: Payer: Self-pay | Admitting: Gastroenterology

## 2020-01-05 DIAGNOSIS — Z9181 History of falling: Secondary | ICD-10-CM | POA: Diagnosis not present

## 2020-01-05 DIAGNOSIS — Z1331 Encounter for screening for depression: Secondary | ICD-10-CM | POA: Diagnosis not present

## 2020-01-05 DIAGNOSIS — E785 Hyperlipidemia, unspecified: Secondary | ICD-10-CM | POA: Diagnosis not present

## 2020-01-05 DIAGNOSIS — Z0000 Encounter for general adult medical examination without abnormal findings: Secondary | ICD-10-CM | POA: Diagnosis not present

## 2020-01-05 DIAGNOSIS — Z139 Encounter for screening, unspecified: Secondary | ICD-10-CM | POA: Diagnosis not present

## 2020-01-14 DIAGNOSIS — M216X2 Other acquired deformities of left foot: Secondary | ICD-10-CM | POA: Diagnosis not present

## 2020-01-14 DIAGNOSIS — L851 Acquired keratosis [keratoderma] palmaris et plantaris: Secondary | ICD-10-CM | POA: Diagnosis not present

## 2020-01-14 DIAGNOSIS — M216X1 Other acquired deformities of right foot: Secondary | ICD-10-CM | POA: Diagnosis not present

## 2020-03-30 DIAGNOSIS — R42 Dizziness and giddiness: Secondary | ICD-10-CM | POA: Diagnosis not present

## 2020-05-04 DIAGNOSIS — L661 Lichen planopilaris: Secondary | ICD-10-CM | POA: Diagnosis not present

## 2020-05-04 DIAGNOSIS — Z5181 Encounter for therapeutic drug level monitoring: Secondary | ICD-10-CM | POA: Diagnosis not present

## 2020-05-16 DIAGNOSIS — L814 Other melanin hyperpigmentation: Secondary | ICD-10-CM | POA: Diagnosis not present

## 2020-05-16 DIAGNOSIS — L738 Other specified follicular disorders: Secondary | ICD-10-CM | POA: Diagnosis not present

## 2020-05-16 DIAGNOSIS — D225 Melanocytic nevi of trunk: Secondary | ICD-10-CM | POA: Diagnosis not present

## 2020-05-16 DIAGNOSIS — D1801 Hemangioma of skin and subcutaneous tissue: Secondary | ICD-10-CM | POA: Diagnosis not present

## 2020-05-16 DIAGNOSIS — L905 Scar conditions and fibrosis of skin: Secondary | ICD-10-CM | POA: Diagnosis not present

## 2020-05-16 DIAGNOSIS — L570 Actinic keratosis: Secondary | ICD-10-CM | POA: Diagnosis not present

## 2020-05-16 DIAGNOSIS — B0089 Other herpesviral infection: Secondary | ICD-10-CM | POA: Diagnosis not present

## 2020-05-16 DIAGNOSIS — D485 Neoplasm of uncertain behavior of skin: Secondary | ICD-10-CM | POA: Diagnosis not present

## 2020-05-16 DIAGNOSIS — L821 Other seborrheic keratosis: Secondary | ICD-10-CM | POA: Diagnosis not present

## 2020-05-16 DIAGNOSIS — Z85828 Personal history of other malignant neoplasm of skin: Secondary | ICD-10-CM | POA: Diagnosis not present

## 2020-05-18 ENCOUNTER — Other Ambulatory Visit: Payer: Self-pay | Admitting: Obstetrics & Gynecology

## 2020-05-18 DIAGNOSIS — Z1231 Encounter for screening mammogram for malignant neoplasm of breast: Secondary | ICD-10-CM

## 2020-06-27 DIAGNOSIS — Z5181 Encounter for therapeutic drug level monitoring: Secondary | ICD-10-CM | POA: Diagnosis not present

## 2020-06-27 DIAGNOSIS — Z79899 Other long term (current) drug therapy: Secondary | ICD-10-CM | POA: Diagnosis not present

## 2020-06-27 DIAGNOSIS — L661 Lichen planopilaris: Secondary | ICD-10-CM | POA: Diagnosis not present

## 2020-06-27 DIAGNOSIS — L089 Local infection of the skin and subcutaneous tissue, unspecified: Secondary | ICD-10-CM | POA: Diagnosis not present

## 2020-06-27 DIAGNOSIS — L658 Other specified nonscarring hair loss: Secondary | ICD-10-CM | POA: Diagnosis not present

## 2020-06-29 ENCOUNTER — Other Ambulatory Visit: Payer: Self-pay

## 2020-06-29 ENCOUNTER — Ambulatory Visit
Admission: RE | Admit: 2020-06-29 | Discharge: 2020-06-29 | Disposition: A | Discharge status: Home / Self Care | Payer: PPO | Source: Ambulatory Visit | Attending: Obstetrics & Gynecology | Admitting: Obstetrics & Gynecology

## 2020-06-29 DIAGNOSIS — K200 Eosinophilic esophagitis: Secondary | ICD-10-CM | POA: Diagnosis not present

## 2020-06-29 DIAGNOSIS — Z1231 Encounter for screening mammogram for malignant neoplasm of breast: Secondary | ICD-10-CM | POA: Diagnosis not present

## 2020-06-29 DIAGNOSIS — K900 Celiac disease: Secondary | ICD-10-CM | POA: Diagnosis not present

## 2020-06-29 DIAGNOSIS — K580 Irritable bowel syndrome with diarrhea: Secondary | ICD-10-CM | POA: Diagnosis not present

## 2020-08-26 ENCOUNTER — Other Ambulatory Visit: Payer: Self-pay

## 2020-08-26 ENCOUNTER — Encounter: Payer: Self-pay | Admitting: Obstetrics & Gynecology

## 2020-08-26 ENCOUNTER — Ambulatory Visit (INDEPENDENT_AMBULATORY_CARE_PROVIDER_SITE_OTHER): Payer: PPO | Admitting: Obstetrics & Gynecology

## 2020-08-26 VITALS — BP 116/70 | Ht 63.0 in | Wt 123.6 lb

## 2020-08-26 DIAGNOSIS — Z780 Asymptomatic menopausal state: Secondary | ICD-10-CM | POA: Diagnosis not present

## 2020-08-26 DIAGNOSIS — R634 Abnormal weight loss: Secondary | ICD-10-CM | POA: Diagnosis not present

## 2020-08-26 DIAGNOSIS — Z01419 Encounter for gynecological examination (general) (routine) without abnormal findings: Secondary | ICD-10-CM

## 2020-08-26 DIAGNOSIS — M8589 Other specified disorders of bone density and structure, multiple sites: Secondary | ICD-10-CM

## 2020-08-26 NOTE — Progress Notes (Signed)
Kerry Lewis [birth year] [id number]   History:    69 y.o. G3P2A1L2 G3P2A1L2 Married.  Husband recently Dxed with Dementia.  RP:  Established patient presenting for annual gyn exam   HPI: Postmenopause, well on no HRT.  No PMB.  No pelvic pain.  Pap 08/2018 Neg. Sexually active, but no IC because of vaginal dryness/pain with IC.  Urine/BMs normal.  Breasts normal.  Screening mammo neg 06/2020. BMI 21.89.  Recent unwanted weight loss. Very good fitness, healthy nutrition.  Health labs with Fam MD.  BD scheduled for 09/2020.  Colono 2019.  Past medical history,surgical history, family history and social history were all reviewed and documented in the EPIC chart.  Gynecologic History No LMP recorded. Patient is postmenopausal.  Obstetric History OB History  Gravida Para Term Preterm AB Living  3 2     1 2   SAB TAB Ectopic Multiple Live Births  1            # Outcome Date GA Lbr Len/2nd Weight Sex Delivery Anes PTL Lv  3 SAB           2 Para           1 Para              ROS: A ROS was performed and pertinent positives and negatives are included in the history.  GENERAL: No fevers or chills. HEENT: No change in vision, no earache, sore throat or sinus congestion. NECK: No pain or stiffness. CARDIOVASCULAR: No chest pain or pressure. No palpitations. PULMONARY: No shortness of breath, cough or wheeze. GASTROINTESTINAL: No abdominal pain, nausea, vomiting or diarrhea, melena or bright red blood per rectum. GENITOURINARY: No urinary frequency, urgency, hesitancy or dysuria. MUSCULOSKELETAL: No joint or muscle pain, no back pain, no recent trauma. DERMATOLOGIC: No rash, no itching, no lesions. ENDOCRINE: No polyuria, polydipsia, no heat or cold intolerance. No recent change in weight. HEMATOLOGICAL: No anemia or easy bruising or bleeding. NEUROLOGIC: No headache, seizures, numbness, tingling or weakness. PSYCHIATRIC: No depression, no loss of interest in normal activity or change in  sleep pattern.     Exam:   BP 116/70   Ht 5' 3"  (1.6 m)   Wt 123 lb 9.6 oz (56.1 kg)   BMI 21.89 kg/m   Body mass index is 21.89 kg/m.  General appearance : Well developed well nourished female. No acute distress HEENT: Eyes: no retinal hemorrhage or exudates,  Neck supple, trachea midline, no carotid bruits, no thyroidmegaly Lungs: Clear to auscultation, no rhonchi or wheezes, or rib retractions  Heart: Regular rate and rhythm, no murmurs or gallops Breast:Examined in sitting and supine position were symmetrical in appearance, no palpable masses or tenderness,  no skin retraction, no nipple inversion, no nipple discharge, no skin discoloration, no axillary or supraclavicular lymphadenopathy Abdomen: no palpable masses or tenderness, no rebound or guarding Extremities: no edema or skin discoloration or tenderness  Pelvic: Vulva: Normal             Vagina: No gross lesions or discharge  Cervix: No gross lesions or discharge  Uterus AV, normal size, shape and consistency, non-tender and mobile  Adnexa  Without masses or tenderness  Anus: Normal   Assessment/Plan:  69 y.o. female for annual exam   1. Well female exam with routine gynecological exam Normal gynecologic exam in menopause.  No indication to repeat a Pap test this year.  Breast exam normal.  Screening mammogram negative in August 2021.  Colonoscopy 2019.  Good body mass index at 21.89.  Good fitness and healthy nutrition.  Health labs with family physician.  2. Postmenopausal Well on no hormone replacement therapy.  No postmenopausal bleeding.  3. Osteopenia of multiple sites Osteopenia on last bone density.  Repeat bone density is scheduled for November 2021.  Continue with vitamin D supplements, calcium intake of 1500 mg daily and regular weightbearing physical activities.  4. Weight loss Mild weight loss recently.  Body mass index still in the normal range.  Will check TSH today. - TSH  Princess Bruins MD,  12:27 PM 08/26/2020

## 2020-08-27 LAB — TSH: TSH: 1.93 mIU/L (ref 0.40–4.50)

## 2020-08-28 ENCOUNTER — Encounter: Payer: Self-pay | Admitting: Obstetrics & Gynecology

## 2020-09-22 ENCOUNTER — Other Ambulatory Visit: Payer: Self-pay

## 2020-09-22 DIAGNOSIS — M8589 Other specified disorders of bone density and structure, multiple sites: Secondary | ICD-10-CM

## 2020-10-27 DIAGNOSIS — Z0000 Encounter for general adult medical examination without abnormal findings: Secondary | ICD-10-CM | POA: Diagnosis not present

## 2020-10-27 DIAGNOSIS — K580 Irritable bowel syndrome with diarrhea: Secondary | ICD-10-CM | POA: Diagnosis not present

## 2020-10-27 DIAGNOSIS — Z13220 Encounter for screening for lipoid disorders: Secondary | ICD-10-CM | POA: Diagnosis not present

## 2020-10-27 DIAGNOSIS — K200 Eosinophilic esophagitis: Secondary | ICD-10-CM | POA: Diagnosis not present

## 2020-10-27 DIAGNOSIS — Z1159 Encounter for screening for other viral diseases: Secondary | ICD-10-CM | POA: Diagnosis not present

## 2020-11-18 DIAGNOSIS — L905 Scar conditions and fibrosis of skin: Secondary | ICD-10-CM | POA: Diagnosis not present

## 2020-11-18 DIAGNOSIS — D225 Melanocytic nevi of trunk: Secondary | ICD-10-CM | POA: Diagnosis not present

## 2020-11-18 DIAGNOSIS — L578 Other skin changes due to chronic exposure to nonionizing radiation: Secondary | ICD-10-CM | POA: Diagnosis not present

## 2020-11-18 DIAGNOSIS — Z85828 Personal history of other malignant neoplasm of skin: Secondary | ICD-10-CM | POA: Diagnosis not present

## 2020-11-18 DIAGNOSIS — L814 Other melanin hyperpigmentation: Secondary | ICD-10-CM | POA: Diagnosis not present

## 2020-11-18 DIAGNOSIS — L570 Actinic keratosis: Secondary | ICD-10-CM | POA: Diagnosis not present

## 2020-11-18 DIAGNOSIS — L821 Other seborrheic keratosis: Secondary | ICD-10-CM | POA: Diagnosis not present

## 2020-12-01 ENCOUNTER — Other Ambulatory Visit: Payer: Self-pay | Admitting: Obstetrics & Gynecology

## 2020-12-01 DIAGNOSIS — Z1231 Encounter for screening mammogram for malignant neoplasm of breast: Secondary | ICD-10-CM

## 2020-12-06 DIAGNOSIS — Z5181 Encounter for therapeutic drug level monitoring: Secondary | ICD-10-CM | POA: Diagnosis not present

## 2020-12-14 DIAGNOSIS — R42 Dizziness and giddiness: Secondary | ICD-10-CM | POA: Diagnosis not present

## 2020-12-19 DIAGNOSIS — Z79899 Other long term (current) drug therapy: Secondary | ICD-10-CM | POA: Diagnosis not present

## 2020-12-19 DIAGNOSIS — L658 Other specified nonscarring hair loss: Secondary | ICD-10-CM | POA: Diagnosis not present

## 2020-12-19 DIAGNOSIS — Z5181 Encounter for therapeutic drug level monitoring: Secondary | ICD-10-CM | POA: Diagnosis not present

## 2020-12-19 DIAGNOSIS — L661 Lichen planopilaris: Secondary | ICD-10-CM | POA: Diagnosis not present

## 2020-12-19 DIAGNOSIS — L089 Local infection of the skin and subcutaneous tissue, unspecified: Secondary | ICD-10-CM | POA: Diagnosis not present

## 2020-12-23 DIAGNOSIS — H16233 Neurotrophic keratoconjunctivitis, bilateral: Secondary | ICD-10-CM | POA: Diagnosis not present

## 2021-01-06 ENCOUNTER — Other Ambulatory Visit: Payer: PPO

## 2021-02-21 DIAGNOSIS — L851 Acquired keratosis [keratoderma] palmaris et plantaris: Secondary | ICD-10-CM | POA: Diagnosis not present

## 2021-04-02 DIAGNOSIS — L237 Allergic contact dermatitis due to plants, except food: Secondary | ICD-10-CM | POA: Diagnosis not present

## 2021-04-17 DIAGNOSIS — L814 Other melanin hyperpigmentation: Secondary | ICD-10-CM | POA: Diagnosis not present

## 2021-04-17 DIAGNOSIS — L570 Actinic keratosis: Secondary | ICD-10-CM | POA: Diagnosis not present

## 2021-04-17 DIAGNOSIS — D225 Melanocytic nevi of trunk: Secondary | ICD-10-CM | POA: Diagnosis not present

## 2021-04-17 DIAGNOSIS — Z85828 Personal history of other malignant neoplasm of skin: Secondary | ICD-10-CM | POA: Diagnosis not present

## 2021-04-17 DIAGNOSIS — L821 Other seborrheic keratosis: Secondary | ICD-10-CM | POA: Diagnosis not present

## 2021-04-17 DIAGNOSIS — L905 Scar conditions and fibrosis of skin: Secondary | ICD-10-CM | POA: Diagnosis not present

## 2021-04-17 DIAGNOSIS — D1801 Hemangioma of skin and subcutaneous tissue: Secondary | ICD-10-CM | POA: Diagnosis not present

## 2021-04-17 DIAGNOSIS — L578 Other skin changes due to chronic exposure to nonionizing radiation: Secondary | ICD-10-CM | POA: Diagnosis not present

## 2021-05-12 DIAGNOSIS — Z5181 Encounter for therapeutic drug level monitoring: Secondary | ICD-10-CM | POA: Diagnosis not present

## 2021-06-09 ENCOUNTER — Ambulatory Visit
Admission: RE | Admit: 2021-06-09 | Discharge: 2021-06-09 | Disposition: A | Discharge status: Home / Self Care | Payer: PPO | Source: Ambulatory Visit | Attending: Obstetrics & Gynecology | Admitting: Obstetrics & Gynecology

## 2021-06-09 ENCOUNTER — Other Ambulatory Visit: Payer: Self-pay

## 2021-06-09 DIAGNOSIS — M8589 Other specified disorders of bone density and structure, multiple sites: Secondary | ICD-10-CM

## 2021-06-09 DIAGNOSIS — Z780 Asymptomatic menopausal state: Secondary | ICD-10-CM | POA: Diagnosis not present

## 2021-06-20 ENCOUNTER — Other Ambulatory Visit: Payer: Self-pay

## 2021-06-20 ENCOUNTER — Ambulatory Visit (INDEPENDENT_AMBULATORY_CARE_PROVIDER_SITE_OTHER): Payer: PPO | Admitting: Gastroenterology

## 2021-06-20 ENCOUNTER — Encounter: Payer: Self-pay | Admitting: Gastroenterology

## 2021-06-20 VITALS — BP 110/68 | HR 76 | Ht 63.0 in | Wt 125.5 lb

## 2021-06-20 DIAGNOSIS — K200 Eosinophilic esophagitis: Secondary | ICD-10-CM | POA: Diagnosis not present

## 2021-06-20 DIAGNOSIS — K900 Celiac disease: Secondary | ICD-10-CM | POA: Diagnosis not present

## 2021-06-20 DIAGNOSIS — K580 Irritable bowel syndrome with diarrhea: Secondary | ICD-10-CM

## 2021-06-20 MED ORDER — HYOSCYAMINE SULFATE 0.125 MG PO TABS: ORAL_TABLET | ORAL | 6 refills | Status: DC

## 2021-06-20 MED ORDER — ELUXADOLINE 100 MG PO TABS: 1.0000 | ORAL_TABLET | Freq: Every day | ORAL | 0 refills | Status: DC

## 2021-06-20 MED ORDER — DIPHENOXYLATE-ATROPINE 2.5-0.025 MG PO TABS: 1.0000 | ORAL_TABLET | Freq: Every day | ORAL | 0 refills | Status: DC

## 2021-06-20 NOTE — Patient Instructions (Addendum)
If you are age 72 or older, your body mass index should be between 23-30. Your Body mass index is 22.23 kg/m. If this is out of the aforementioned range listed, please consider follow up with your Primary Care Provider.  If you are age 32 or younger, your body mass index should be between 19-25. Your Body mass index is 22.23 kg/m. If this is out of the aformentioned range listed, please consider follow up with your Primary Care Provider.   __________________________________________________________  The [facility] providers would like to encourage you to use [facility] to communicate with providers for non-urgent requests or questions.  Due to long hold times on the telephone, sending your provider a message by [facility] may be a faster and more efficient way to get a response.  Please allow 48 business hours for a response.  Please remember that this is for non-urgent requests.   Please take your scripts to the pharmacy to be filled.  Please call with any questions or concerns.  Thank you,  Dr. Jackquline Denmark

## 2021-06-20 NOTE — Progress Notes (Signed)
Chief Complaint: FU  Referring Provider:  Alroy Dust, Carlean Jews.Marlou Sa, MD      ASSESSMENT AND PLAN;   #1. Celiac disease: Dx 2008. Patient has been compliant with gluten-free diet.  #2. 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 Seen Dr Neldon Mc, neg allergy testing but advised gluten and lactose-free diet. Has been treated with steroid inhalers.  #3. IBS with diarrhea, rule out other causes especially lymphocytic colitis associated with celiac disease. Neg colon 06/2018 with neg random Bx.  Plan:   -Continue Lomotil 1 tablet p.o. nightly as needed.  #90 -Continue hyoscyamine 0.125 mg sublingual 1 to 2 tablets every 6-8 hours as needed #180, 6 refills -Viberzi 161m po QD #60 -Continue to avoid lactose as well as suggested by Dr. KNeldon Mc Pt not having any significant dysphagia currently.  Hence, we would hold off on steroid inhalers and continue her on Protonix 40 mg p.o. once a day.  She will get in touch with [place] starts having any problems in the future. -Continue gluten-free diet.   HPI:    JSiona Coulstonis a 70y.o. female  Long-standing history of diarrhea Here for medication refill.   Bms 7-8 times per day without any  nocturnal symptoms. Occ 2-3/day. Had neg stool studies. Neg colon 06/2018 with neg TI and random bx   No melena or hematochezia. No unintentional weight loss. She has been compliant with gluten-free diet.  Her celiac antibodies were negative confirming compliance with gluten-free diet. Has been diagnosed as having IBS as well. Did well with Bentyl but had dryness of the mouth. Takes Lomotil one at night and Levsin in the morning when she goes out and plays golf. Denies having any nausea or vomiting.   Diagnosed with eosinophilic esophagitis on EGD with biopsies on 02/25/2017 with distal esophageal stricture s/p dilatation to [id number] with resolution of dysphagia.  Celebrating her 50th wedding  anniversary in June 2019.  Colon 06/2018 - Mild pancolonic diverticulosis. - Minimal melanosis in the left colon. - Otherwise normal colonoscopy to TI. - Neg TI and colon Bx - No need to rpt unless problems  Past Medical History:  Diagnosis Date   Anxiety and depression    Cancer (HYoe    BASAL CELL CARCINOMA ON FACE   Celiac disease    Colon polyps    Eosinophilic esophagitis    GERD (gastroesophageal reflux disease)    History of basal cell carcinoma    Hypothyroidism    IBS (irritable bowel syndrome)    Status post dilation of esophageal narrowing     Past Surgical History:  Procedure Laterality Date   COLONOSCOPY  07/02/2013   Mild pancolonic diverticulosis. Small internal hemorrhoids. otherise normal.   PARATHYROIDECTOMY     TONSILLECTOMY     UPPER GI ENDOSCOPY  08/06/2007   Previously dx Celiac Disease.Risidual pill @ 30cm without any underlying esophageal lesions. S/P biopsies and esophageal dilatation. Esophagus was dilated. Bx: Unremarkable small bowel mucosa with architecturally normal villi, Fungal forms consistent with Candida.   UPPER GI ENDOSCOPY  02/25/2017   Distal esophageal stricture s/p esophagel dilatation. SI Bx: benign small bowel mucosa. Esophagus Bx: Hyperplastic benign squamous mucosa with chronic inflammation and Eosinophila, focally in excess of 40 eosinophils per high power field, consistent with EOE.  Stomach Bx: Fundic glad polyps in a setting of chronic gastritis.    Family History  Problem Relation Age of Onset   Cancer Father  ESOPHAGEAL CANCER    Heart disease Sister    Cancer Maternal Uncle        COLON   Breast cancer Paternal Aunt    Diabetes Paternal Aunt     Social History   Tobacco Use   Smoking status: Never   Smokeless tobacco: Never  Vaping Use   Vaping Use: Never used  Substance Use Topics   Alcohol use: No    Alcohol/week: 0.0 standard drinks   Drug use: No    Current Outpatient Medications  Medication Sig  Dispense Refill   dicyclomine (BENTYL) 10 MG capsule Take 1 capsule (10 mg total) by mouth as needed. 60 capsule 11   diphenoxylate-atropine (LOMOTIL) 2.5-0.025 MG tablet Take 1 tablet by mouth 2 (two) times daily. 60 tablet 2   finasteride (PROSCAR) 5 MG tablet Take 5 mg by mouth daily. HALF A PILL ONCE A DAY     hyoscyamine (LEVSIN, ANASPAZ) 0.125 MG tablet Take 1-2 tablets every 4-6 hours as needed. 120 tablet 6   MAGNESIUM-CALCIUM-FOLIC ACID PO Take by mouth.     Multiple Vitamin (MULTIVITAMIN) tablet Take 1 tablet by mouth daily.     Multiple Vitamins-Minerals (ZINC PO) Take 1 tablet by mouth daily.     OVER THE COUNTER MEDICATION Place into both eyes daily. HydroEye drops     pantoprazole (PROTONIX) 40 MG tablet Take 1 tablet (40 mg total) by mouth daily. 30 tablet 6   traZODone (DESYREL) 100 MG tablet Take 100 mg by mouth at bedtime.     VITAMIN D-VITAMIN K PO Take 5,000 Units by mouth daily.     No current facility-administered medications for this visit.    Allergies  Allergen Reactions   Dairy Aid [Lactase]    Gluten Meal Nausea Only    Celiac disease    Review of Systems:  neg     Physical Exam:    BP 110/68   Pulse 76   Wt 125 lb 8 oz (56.9 kg)   SpO2 98%   BMI 22.23 kg/m  Filed Weights   06/20/21 1106  Weight: 125 lb 8 oz (56.9 kg)   Constitutional:  Well-developed, in no acute distress. Psychiatric: Normal mood and affect. Behavior is normal. HEENT: Pupils normal.  Conjunctivae are normal. No scleral icterus. Cardiovascular: Normal rate, regular rhythm. No edema Pulmonary/chest: Effort normal and breath sounds normal. No wheezing, rales or rhonchi. Abdominal: Soft, nondistended. Nontender. Bowel sounds active throughout. There are no masses palpable. No hepatomegaly. Rectal:  defered Neurological: Alert and oriented to person place and time. Skin: Skin is warm and dry. No rashes noted.     Carmell Austria, MD 06/20/2021, 11:15 AM  Cc: Kerry Lewis, Kerry Sa,  MD

## 2021-06-26 DIAGNOSIS — L658 Other specified nonscarring hair loss: Secondary | ICD-10-CM | POA: Diagnosis not present

## 2021-06-26 DIAGNOSIS — L089 Local infection of the skin and subcutaneous tissue, unspecified: Secondary | ICD-10-CM | POA: Diagnosis not present

## 2021-06-26 DIAGNOSIS — L661 Lichen planopilaris: Secondary | ICD-10-CM | POA: Diagnosis not present

## 2021-06-26 DIAGNOSIS — Z5181 Encounter for therapeutic drug level monitoring: Secondary | ICD-10-CM | POA: Diagnosis not present

## 2021-06-27 DIAGNOSIS — L6612 Frontal fibrosing alopecia: Secondary | ICD-10-CM

## 2021-06-27 DIAGNOSIS — L661 Lichen planopilaris, unspecified: Secondary | ICD-10-CM

## 2021-06-27 HISTORY — DX: Lichen planopilaris, unspecified: L66.10

## 2021-06-27 HISTORY — DX: Lichen planopilaris: L66.1

## 2021-06-27 HISTORY — DX: Frontal fibrosing alopecia: L66.12

## 2021-06-30 ENCOUNTER — Other Ambulatory Visit: Payer: Self-pay

## 2021-06-30 ENCOUNTER — Ambulatory Visit
Admission: RE | Admit: 2021-06-30 | Discharge: 2021-06-30 | Disposition: A | Discharge status: Home / Self Care | Payer: PPO | Source: Ambulatory Visit | Attending: Obstetrics & Gynecology | Admitting: Obstetrics & Gynecology

## 2021-06-30 DIAGNOSIS — Z1231 Encounter for screening mammogram for malignant neoplasm of breast: Secondary | ICD-10-CM

## 2021-07-20 DIAGNOSIS — M1711 Unilateral primary osteoarthritis, right knee: Secondary | ICD-10-CM | POA: Diagnosis not present

## 2021-07-20 DIAGNOSIS — M25561 Pain in right knee: Secondary | ICD-10-CM | POA: Diagnosis not present

## 2021-08-21 ENCOUNTER — Telehealth: Payer: Self-pay | Admitting: Gastroenterology

## 2021-08-21 ENCOUNTER — Other Ambulatory Visit: Payer: Self-pay | Admitting: General Surgery

## 2021-08-21 MED ORDER — PANTOPRAZOLE SODIUM 40 MG PO TBEC: 40.0000 mg | DELAYED_RELEASE_TABLET | Freq: Every day | ORAL | 6 refills | Status: DC

## 2021-08-21 NOTE — Telephone Encounter (Signed)
Refilled protonix to [facility] per request

## 2021-08-21 NOTE — Telephone Encounter (Signed)
Pt needs a refill of generic Protonix.

## 2021-09-13 ENCOUNTER — Ambulatory Visit (INDEPENDENT_AMBULATORY_CARE_PROVIDER_SITE_OTHER): Payer: PPO | Admitting: Obstetrics & Gynecology

## 2021-09-13 ENCOUNTER — Encounter: Payer: Self-pay | Admitting: Obstetrics & Gynecology

## 2021-09-13 ENCOUNTER — Other Ambulatory Visit: Payer: Self-pay

## 2021-09-13 VITALS — BP 128/70 | HR 70 | Resp 16 | Ht 62.75 in | Wt 126.0 lb

## 2021-09-13 DIAGNOSIS — Z780 Asymptomatic menopausal state: Secondary | ICD-10-CM

## 2021-09-13 DIAGNOSIS — Z01419 Encounter for gynecological examination (general) (routine) without abnormal findings: Secondary | ICD-10-CM

## 2021-09-13 DIAGNOSIS — Z9189 Other specified personal risk factors, not elsewhere classified: Secondary | ICD-10-CM

## 2021-09-13 DIAGNOSIS — M8589 Other specified disorders of bone density and structure, multiple sites: Secondary | ICD-10-CM

## 2021-09-13 NOTE — Progress Notes (Signed)
Kerry Lewis 1950-12-13 [id number]   History:    70 y.o. Married.  Husband recently Dxed with Dementia.   RP:  Established patient presenting for annual gyn exam    HPI: Postmenopause, well on no HRT.  No PMB.  No pelvic pain.  Pap 08/2018 Neg.  Sexually active, but no IC because of vaginal dryness/pain with IC.  Urine/BMs normal.  Breasts normal.  Screening mammo neg 06/2021. BMI 22.5.  Very good fitness, healthy nutrition.  Health labs with Fam MD.  BD 06/2021 Osteopenia, stable with T-Score -2.3 at the Lt Femoral Neck.  Colono 2019.   Past medical history,surgical history, family history and social history were all reviewed and documented in the EPIC chart.  Gynecologic History No LMP recorded. Patient is postmenopausal.  Obstetric History OB History  Gravida Para Term Preterm AB Living  3 2     1 2   SAB IAB Ectopic Multiple Live Births  1            # Outcome Date GA Lbr Len/2nd Weight Sex Delivery Anes PTL Lv  3 SAB           2 Para           1 Para              ROS: A ROS was performed and pertinent positives and negatives are included in the history.  GENERAL: No fevers or chills. HEENT: No change in vision, no earache, sore throat or sinus congestion. NECK: No pain or stiffness. CARDIOVASCULAR: No chest pain or pressure. No palpitations. PULMONARY: No shortness of breath, cough or wheeze. GASTROINTESTINAL: No abdominal pain, nausea, vomiting or diarrhea, melena or bright red blood per rectum. GENITOURINARY: No urinary frequency, urgency, hesitancy or dysuria. MUSCULOSKELETAL: No joint or muscle pain, no back pain, no recent trauma. DERMATOLOGIC: No rash, no itching, no lesions. ENDOCRINE: No polyuria, polydipsia, no heat or cold intolerance. No recent change in weight. HEMATOLOGICAL: No anemia or easy bruising or bleeding. NEUROLOGIC: No headache, seizures, numbness, tingling or weakness. PSYCHIATRIC: No depression, no loss of interest in normal activity or change in  sleep pattern.     Exam:   BP 128/70   Pulse 70   Resp 16   Ht 5' 2.75" (1.594 m)   Wt 126 lb (57.2 kg)   BMI 22.50 kg/m   Body mass index is 22.5 kg/m.  General appearance : Well developed well nourished female. No acute distress HEENT: Eyes: no retinal hemorrhage or exudates,  Neck supple, trachea midline, no carotid bruits, no thyroidmegaly Lungs: Clear to auscultation, no rhonchi or wheezes, or rib retractions  Heart: Regular rate and rhythm, no murmurs or gallops Breast:Examined in sitting and supine position were symmetrical in appearance, no palpable masses or tenderness,  no skin retraction, no nipple inversion, no nipple discharge, no skin discoloration, no axillary or supraclavicular lymphadenopathy Abdomen: no palpable masses or tenderness, no rebound or guarding Extremities: no edema or skin discoloration or tenderness  Pelvic: Vulva: Normal             Vagina: No gross lesions or discharge  Cervix: No gross lesions or discharge  Uterus  AV, normal size, shape and consistency, non-tender and mobile  Adnexa  Without masses or tenderness  Anus: Normal   Assessment/Plan:  70 y.o. female for annual exam   1. Well female exam with routine gynecological exam at risk for fracture Postmenopause, well on no HRT.  No PMB.  No  pelvic pain.  Pap 08/2018 Neg.  No indication to repeat a Pap at this time.  Sexually active, but no IC because of vaginal dryness/pain with IC.  Urine/BMs normal.  Breasts normal.  Screening mammo neg 06/2021. BMI 22.5.  Very good fitness, healthy nutrition.  Health labs with Fam MD.  BD 06/2021 Osteopenia, stable with T-Score -2.3 at the Lt Femoral Neck.  Colono 2019.  2. Postmenopausal Postmenopause, well on no HRT.  No PMB.   3. Osteopenia of multiple sites BD 06/2021 Osteopenia, stable with T-Score -2.3 at the Lt Femoral Neck.  Continue with vitamin D supplements, calcium intake 1.5 g/day total and regular weightbearing physical activities to  continue.  Other orders - hydroxychloroquine (PLAQUENIL) 200 MG tablet; Take 200 mg by mouth 2 (two) times daily. - traZODone (DESYREL) 150 MG tablet; Take 150 mg by mouth at bedtime. - ketoconazole (NIZORAL) 2 % shampoo; Apply to scalp for 5-10 minutes 2-3 times weekly. - Omega 3 340 MG CPDR; 1 capsule - tacrolimus (PROTOPIC) 0.1 % ointment; Apply to affected areas of scalp three times weekly - triamcinolone ointment (KENALOG) 0.1 %; Apply topically.   Princess Bruins MD, 11:42 AM 09/13/2021

## 2021-10-02 DIAGNOSIS — L570 Actinic keratosis: Secondary | ICD-10-CM | POA: Diagnosis not present

## 2021-10-02 DIAGNOSIS — L814 Other melanin hyperpigmentation: Secondary | ICD-10-CM | POA: Diagnosis not present

## 2021-10-02 DIAGNOSIS — L821 Other seborrheic keratosis: Secondary | ICD-10-CM | POA: Diagnosis not present

## 2021-10-02 DIAGNOSIS — Z08 Encounter for follow-up examination after completed treatment for malignant neoplasm: Secondary | ICD-10-CM | POA: Diagnosis not present

## 2021-10-02 DIAGNOSIS — Z85828 Personal history of other malignant neoplasm of skin: Secondary | ICD-10-CM | POA: Diagnosis not present

## 2021-10-02 DIAGNOSIS — D225 Melanocytic nevi of trunk: Secondary | ICD-10-CM | POA: Diagnosis not present

## 2021-10-02 DIAGNOSIS — D485 Neoplasm of uncertain behavior of skin: Secondary | ICD-10-CM | POA: Diagnosis not present

## 2021-10-02 DIAGNOSIS — L538 Other specified erythematous conditions: Secondary | ICD-10-CM | POA: Diagnosis not present

## 2021-10-16 ENCOUNTER — Other Ambulatory Visit: Payer: Self-pay | Admitting: Gastroenterology

## 2021-10-18 ENCOUNTER — Other Ambulatory Visit: Payer: Self-pay | Admitting: Gastroenterology

## 2021-10-20 ENCOUNTER — Telehealth: Payer: Self-pay | Admitting: Gastroenterology

## 2021-10-20 NOTE — Telephone Encounter (Signed)
Inbound call from patient requesting medication refill for Generic for Lomotil 90 day supply sent to [facility] in [place]

## 2021-10-20 NOTE — Telephone Encounter (Signed)
It has been sent!

## 2021-10-23 DIAGNOSIS — Z7689 Persons encountering health services in other specified circumstances: Secondary | ICD-10-CM | POA: Diagnosis not present

## 2021-10-23 DIAGNOSIS — K219 Gastro-esophageal reflux disease without esophagitis: Secondary | ICD-10-CM | POA: Diagnosis not present

## 2021-10-23 DIAGNOSIS — E079 Disorder of thyroid, unspecified: Secondary | ICD-10-CM | POA: Diagnosis not present

## 2021-10-23 DIAGNOSIS — E559 Vitamin D deficiency, unspecified: Secondary | ICD-10-CM | POA: Diagnosis not present

## 2021-10-23 DIAGNOSIS — E785 Hyperlipidemia, unspecified: Secondary | ICD-10-CM | POA: Diagnosis not present

## 2021-10-23 DIAGNOSIS — K900 Celiac disease: Secondary | ICD-10-CM | POA: Diagnosis not present

## 2021-10-23 DIAGNOSIS — K589 Irritable bowel syndrome without diarrhea: Secondary | ICD-10-CM | POA: Diagnosis not present

## 2021-11-02 DIAGNOSIS — K900 Celiac disease: Secondary | ICD-10-CM | POA: Diagnosis not present

## 2021-11-02 DIAGNOSIS — E785 Hyperlipidemia, unspecified: Secondary | ICD-10-CM | POA: Diagnosis not present

## 2021-11-02 DIAGNOSIS — E559 Vitamin D deficiency, unspecified: Secondary | ICD-10-CM | POA: Diagnosis not present

## 2021-11-02 DIAGNOSIS — E079 Disorder of thyroid, unspecified: Secondary | ICD-10-CM | POA: Diagnosis not present

## 2021-11-05 DIAGNOSIS — R350 Frequency of micturition: Secondary | ICD-10-CM | POA: Diagnosis not present

## 2021-12-25 DIAGNOSIS — L661 Lichen planopilaris: Secondary | ICD-10-CM | POA: Diagnosis not present

## 2021-12-25 DIAGNOSIS — Z5181 Encounter for therapeutic drug level monitoring: Secondary | ICD-10-CM | POA: Diagnosis not present

## 2021-12-26 DIAGNOSIS — H04123 Dry eye syndrome of bilateral lacrimal glands: Secondary | ICD-10-CM | POA: Diagnosis not present

## 2021-12-26 DIAGNOSIS — H26491 Other secondary cataract, right eye: Secondary | ICD-10-CM | POA: Diagnosis not present

## 2022-01-01 DIAGNOSIS — L089 Local infection of the skin and subcutaneous tissue, unspecified: Secondary | ICD-10-CM | POA: Diagnosis not present

## 2022-01-01 DIAGNOSIS — L658 Other specified nonscarring hair loss: Secondary | ICD-10-CM | POA: Diagnosis not present

## 2022-01-01 DIAGNOSIS — Z79899 Other long term (current) drug therapy: Secondary | ICD-10-CM | POA: Diagnosis not present

## 2022-01-01 DIAGNOSIS — Z5181 Encounter for therapeutic drug level monitoring: Secondary | ICD-10-CM | POA: Diagnosis not present

## 2022-01-01 DIAGNOSIS — L661 Lichen planopilaris: Secondary | ICD-10-CM | POA: Diagnosis not present

## 2022-01-30 ENCOUNTER — Other Ambulatory Visit: Payer: Self-pay | Admitting: Gastroenterology

## 2022-01-31 DIAGNOSIS — R079 Chest pain, unspecified: Secondary | ICD-10-CM | POA: Diagnosis not present

## 2022-02-04 ENCOUNTER — Encounter: Payer: Self-pay | Admitting: Cardiology

## 2022-02-04 DIAGNOSIS — I361 Nonrheumatic tricuspid (valve) insufficiency: Secondary | ICD-10-CM | POA: Diagnosis not present

## 2022-02-04 DIAGNOSIS — N200 Calculus of kidney: Secondary | ICD-10-CM | POA: Diagnosis not present

## 2022-02-04 DIAGNOSIS — R55 Syncope and collapse: Secondary | ICD-10-CM | POA: Diagnosis not present

## 2022-02-04 DIAGNOSIS — R0789 Other chest pain: Secondary | ICD-10-CM | POA: Diagnosis not present

## 2022-02-04 DIAGNOSIS — R079 Chest pain, unspecified: Secondary | ICD-10-CM | POA: Diagnosis not present

## 2022-02-04 DIAGNOSIS — F419 Anxiety disorder, unspecified: Secondary | ICD-10-CM | POA: Diagnosis not present

## 2022-02-04 DIAGNOSIS — R748 Abnormal levels of other serum enzymes: Secondary | ICD-10-CM | POA: Diagnosis not present

## 2022-02-04 DIAGNOSIS — R072 Precordial pain: Secondary | ICD-10-CM | POA: Diagnosis not present

## 2022-02-04 DIAGNOSIS — D259 Leiomyoma of uterus, unspecified: Secondary | ICD-10-CM | POA: Diagnosis not present

## 2022-02-04 DIAGNOSIS — I959 Hypotension, unspecified: Secondary | ICD-10-CM | POA: Diagnosis not present

## 2022-02-04 DIAGNOSIS — R42 Dizziness and giddiness: Secondary | ICD-10-CM | POA: Diagnosis not present

## 2022-02-04 DIAGNOSIS — K200 Eosinophilic esophagitis: Secondary | ICD-10-CM | POA: Diagnosis not present

## 2022-02-04 DIAGNOSIS — Z7982 Long term (current) use of aspirin: Secondary | ICD-10-CM | POA: Diagnosis not present

## 2022-02-04 DIAGNOSIS — K900 Celiac disease: Secondary | ICD-10-CM | POA: Diagnosis not present

## 2022-02-04 DIAGNOSIS — I340 Nonrheumatic mitral (valve) insufficiency: Secondary | ICD-10-CM | POA: Diagnosis not present

## 2022-02-04 DIAGNOSIS — Z79899 Other long term (current) drug therapy: Secondary | ICD-10-CM | POA: Diagnosis not present

## 2022-02-04 NOTE — Progress Notes (Deleted)
?Cardiology Office Note:   ? ?Date:  02/04/2022  ? ?ID:  [name], DOB 10-27-1951, MRN [id number] ? ?PCP:  Townsend Roger, MD  ?Cardiologist:  Shirlee More, MD  ? ?Referring MD: Townsend Roger, MD ? ?ASSESSMENT:   ? ?No diagnosis found. ?PLAN:   ? ?In order of problems listed above: ? ?*** ? ?Next appointment ? ? ?Medication Adjustments/Labs and Tests Ordered: ?Current medicines are reviewed at length with the patient today.  Concerns regarding medicines are outlined above.  ?No orders of the defined types were placed in this encounter. ? ?No orders of the defined types were placed in this encounter. ?  ? ?No chief complaint on file. ?*** ? ?History of Present Illness:   ? ?Kerry Lewis is a 71 y.o. female with a history of GERD and eosinophilic esophagitis and distal esophageal stricture with esophageal dilation who is being seen today for the evaluation of chest pain at the request of Townsend Roger, MD. Her chart review shows no previous cardiology evaluation or diagnostic cardiac imaging. ? ? ?Past Medical History:  ?Diagnosis Date  ? Anxiety and depression   ? Cancer [facility])   ? BASAL CELL CARCINOMA ON FACE  ? Celiac disease   ? Colon polyps   ? Eosinophilic esophagitis   ? GERD (gastroesophageal reflux disease)   ? History of basal cell carcinoma   ? Hypothyroidism   ? IBS (irritable bowel syndrome)   ? Status post dilation of esophageal narrowing   ? ? ?Past Surgical History:  ?Procedure Laterality Date  ? COLONOSCOPY  07/02/2013  ? Mild pancolonic diverticulosis. Small internal hemorrhoids. otherise normal.  ? PARATHYROIDECTOMY    ? TONSILLECTOMY    ? UPPER GI ENDOSCOPY  08/06/2007  ? Previously dx Celiac Disease.Risidual pill @ 30cm without any underlying esophageal lesions. S/P biopsies and esophageal dilatation. Esophagus was dilated. Bx: Unremarkable small bowel mucosa with architecturally normal villi, Fungal forms consistent with Candida.  ? UPPER GI ENDOSCOPY  02/25/2017  ? Distal  esophageal stricture s/p esophagel dilatation. SI Bx: benign small bowel mucosa. Esophagus Bx: Hyperplastic benign squamous mucosa with chronic inflammation and Eosinophila, focally in excess of 40 eosinophils per high power field, consistent with EOE.  Stomach Bx: Fundic glad polyps in a setting of chronic gastritis.  ? ? ?Current Medications: ?No outpatient medications have been marked as taking for the 02/05/22 encounter (Appointment) with Richardo Priest, MD.  ?  ? ?Allergies:   Dairy aid [tilactase] and Gluten meal  ? ?Social History  ? ?Socioeconomic History  ? Marital status: Married  ?  Spouse name: Not on file  ? Number of children: Not on file  ? Years of education: Not on file  ? Highest education level: Not on file  ?Occupational History  ? Not on file  ?Tobacco Use  ? Smoking status: Never  ? Smokeless tobacco: Never  ?Vaping Use  ? Vaping Use: Never used  ?Substance and Sexual Activity  ? Alcohol use: No  ?  Alcohol/week: 0.0 standard drinks  ? Drug use: No  ? Sexual activity: Not Currently  ?  Partners: Male  ?  Birth control/protection: Post-menopausal  ?  Comment: 1st intercourse- 16, partners- 1  ?Other Topics Concern  ? Not on file  ?Social History Narrative  ? Not on file  ? ?Social Determinants of Health  ? ?Financial Resource Strain: Not on file  ?Food Insecurity: Not on file  ?Transportation Needs: Not on file  ?Physical  Activity: Not on file  ?Stress: Not on file  ?Social Connections: Not on file  ?  ? ?Family History: ?The patient's ***family history includes Breast cancer in her paternal aunt; Cancer in her father and maternal uncle; Diabetes in her paternal aunt; Heart disease in her sister. ? ?ROS:   ?ROS Please see the history of present illness.    ?*** All other systems reviewed and are negative. ? ?EKGs/Labs/Other Studies Reviewed:   ? ?The following studies were reviewed today: ?*** ? ?EKG:  EKG is *** ordered today.  The ekg ordered today is personally reviewed and demonstrates  *** ? ?Recent Labs: ?No results found for requested labs within last 8760 hours.  ?Recent Lipid Panel ?No results found for: CHOL, TRIG, HDL, CHOLHDL, VLDL, LDLCALC, LDLDIRECT ? ?Physical Exam:   ? ?VS:  There were no vitals taken for this visit.   ? ?Wt Readings from Last 3 Encounters:  ?09/13/21 126 lb (57.2 kg)  ?06/20/21 125 lb 8 oz (56.9 kg)  ?08/26/20 123 lb 9.6 oz (56.1 kg)  ?  ? ?GEN: *** Well nourished, well developed in no acute distress ?HEENT: Normal ?NECK: No JVD; No carotid bruits ?LYMPHATICS: No lymphadenopathy ?CARDIAC: ***RRR, no murmurs, rubs, gallops ?RESPIRATORY:  Clear to auscultation without rales, wheezing or rhonchi  ?ABDOMEN: Soft, non-tender, non-distended ?MUSCULOSKELETAL:  No edema; No deformity  ?SKIN: Warm and dry ?NEUROLOGIC:  Alert and oriented x 3 ?PSYCHIATRIC:  Normal affect  ? ? ? ?Signed, ?Shirlee More, MD  ?02/04/2022 4:42 PM    ?[place] ?

## 2022-02-05 ENCOUNTER — Encounter: Payer: Self-pay | Admitting: Cardiology

## 2022-02-05 ENCOUNTER — Telehealth: Payer: Self-pay | Admitting: Gastroenterology

## 2022-02-05 ENCOUNTER — Ambulatory Visit: Payer: PPO | Admitting: Cardiology

## 2022-02-05 DIAGNOSIS — R079 Chest pain, unspecified: Secondary | ICD-10-CM | POA: Diagnosis not present

## 2022-02-05 DIAGNOSIS — Z8719 Personal history of other diseases of the digestive system: Secondary | ICD-10-CM | POA: Insufficient documentation

## 2022-02-05 DIAGNOSIS — C801 Malignant (primary) neoplasm, unspecified: Secondary | ICD-10-CM | POA: Insufficient documentation

## 2022-02-05 DIAGNOSIS — F419 Anxiety disorder, unspecified: Secondary | ICD-10-CM | POA: Insufficient documentation

## 2022-02-05 DIAGNOSIS — K635 Polyp of colon: Secondary | ICD-10-CM | POA: Insufficient documentation

## 2022-02-05 DIAGNOSIS — K200 Eosinophilic esophagitis: Secondary | ICD-10-CM | POA: Diagnosis not present

## 2022-02-05 DIAGNOSIS — R072 Precordial pain: Secondary | ICD-10-CM | POA: Diagnosis not present

## 2022-02-05 DIAGNOSIS — I340 Nonrheumatic mitral (valve) insufficiency: Secondary | ICD-10-CM | POA: Diagnosis not present

## 2022-02-05 DIAGNOSIS — E039 Hypothyroidism, unspecified: Secondary | ICD-10-CM | POA: Insufficient documentation

## 2022-02-05 DIAGNOSIS — R748 Abnormal levels of other serum enzymes: Secondary | ICD-10-CM | POA: Diagnosis not present

## 2022-02-05 DIAGNOSIS — Z85828 Personal history of other malignant neoplasm of skin: Secondary | ICD-10-CM | POA: Insufficient documentation

## 2022-02-05 DIAGNOSIS — K589 Irritable bowel syndrome without diarrhea: Secondary | ICD-10-CM | POA: Insufficient documentation

## 2022-02-05 DIAGNOSIS — K219 Gastro-esophageal reflux disease without esophagitis: Secondary | ICD-10-CM | POA: Insufficient documentation

## 2022-02-05 DIAGNOSIS — I361 Nonrheumatic tricuspid (valve) insufficiency: Secondary | ICD-10-CM | POA: Diagnosis not present

## 2022-02-05 DIAGNOSIS — Q219 Congenital malformation of cardiac septum, unspecified: Secondary | ICD-10-CM | POA: Diagnosis not present

## 2022-02-05 NOTE — Telephone Encounter (Signed)
Patient called states she is having issues with swallowing and\or eating seeking advise. ?

## 2022-02-06 NOTE — Telephone Encounter (Signed)
Proceed with barium swallow with barium tablet ?If positive, would need EGD with Dil ?RG ?

## 2022-02-06 NOTE — Telephone Encounter (Signed)
Pt was notified of Dr. Lyndel Safe recommendation to proceed with Barium Swallow:  ?Pt stated that she is not having difficulty swallowing: ?Pt stated that she was recently  admitted to the hospital due to her  having chest pain and pain in her Jaw: Pt stated that she had a cardiac workup and all that was negative: Pt states that the Hospital Dr's stated that this was due to her EOE and to contact her GI Dr. ?Pt stated that she wanted to come in for an office visit: pt was notified that Dr. Lyndel Safe schedule  was booked out for about a month although we could schedule her with an App: Pt was scheduled to see Tye Savoy NP on 02/13/2022 at 1:30 in [place]: Pt made aware: ?Pt verbalized understanding with all questions answered.  ? ?

## 2022-02-13 ENCOUNTER — Ambulatory Visit: Payer: PPO | Admitting: Nurse Practitioner

## 2022-02-13 ENCOUNTER — Encounter: Payer: Self-pay | Admitting: Nurse Practitioner

## 2022-02-13 VITALS — BP 124/68 | HR 78 | Ht 64.0 in | Wt 126.0 lb

## 2022-02-13 DIAGNOSIS — K200 Eosinophilic esophagitis: Secondary | ICD-10-CM | POA: Diagnosis not present

## 2022-02-13 NOTE — Progress Notes (Addendum)
? ? ? ?Assessment  ? ?Patient Profile:  ?Kerry Lewis is a 71 y.o. female known to Dr. Lyndel Safe with a past medical history of eosinophilic esophagitis, celiac disease, IBS with diarrhea.  Additional medical history as listed in [place] . ? ?Eosinophilic esophagitis / esophageal stricture. Diagnosed in 2018, s/p esophageal dilation. Currently treated with daily Pantoprazole.  Occasionally has problems swallowing meat.  ? ?Intermittent chest pain ( pressure) and throat pain relieved with nitroglycerin.  Started a few weeks ago. Cardiac evaluation negative (per patient) during recent hospitalization. She was admitted following syncopal episode related to what sounds like hypotension from nitroglycerin tablets. Etiology unclear, ? Esophageal spasm.  ? ?Celiac disease. Follows gluten free diet  ? ?IBS with diarrhea, rule out other causes especially lymphocytic colitis associated with celiac disease. Neg colon 06/2018 with neg random Bx ? ?Addendum ?Received biopsy reports from EGD done by Dr. Lyndel Safe 02/25/2017.  I did not get the actual endoscopy report from [facility] but biopsies are as follows: ?Small bowel biopsies-benign small bowel. ?Esophageal biopsy-hyperplastic benign squamous mucosa with chronic inflammation and eosinophilia, focally in excess of 40 eosinophils per high-power field consistent with eosinophilic esophagitis.  No dysplasia or malignancy. ?Stomach biopsies-fundic gland polyps in the setting of chronic gastritis.  Stain negative for H. pylori.  No intestinal metaplasia or malignancy ? ?2nd addendum ?Got EGD report from 02/25/17 ?EGD with biopsies and esophageal dilation by Dr. Lyndel Safe.  Esophagus was slightly tortuous but normal.  Wide-open distal esophageal stricture noted at the GE junction .  The lower end of the esophagus was examined by narrowband imaging as well.  Multiple biopsies were obtained from the proximal mid and distal esophagus and sent for histopathologic examination.  Stomach was  normal.  Multiple biopsies obtained from the antrum for CLO testing.  Incidental note was made of 8-10 small gastric polyps measuring 4 to 6 mm.  3 of these polyps were removed.  Multiple biopsies taken from the duodenum.  Duodenum was normal.  Will scan report under procedures in epic ? ?Plan  ? ?Will discuss with Dr. Lyndel Safe as to whether he would like to proceed with EGD vrs esophageal manometry.  ? ? ?History of Present Illness  ? ?Chief Complaint : throat and chest pain  ? ? ?Dr. Lyndel Safe diagnosed her with EOE about 4 year ago. She had a distal esophageal stricture with we dilated. She was then treated with a steroid inhaler for 6 weeks. She is on a daily PPI. Occasionally chicken and steak get stuck in esophagus. She was diagnosed with Celiac disease in 2001. She was last seen at [facility] office in August 2022.  ? ? ?Several weeks ago patient began having intermittent burning pain in throat and pressure in chest pain . These pain could occur together or independently of each other. She saw PCP who wanted her to take ASA and Nitro prn while workup started. With subsequent episodes she would take nitro and the pain would in fact get better  Then she woke up on 4/1 around 10 pm with recurrent throat burning. Took nitro, pain resolved. Then woke around 5 am with chest pressure,  took a nitro but pain persisted. Took another nitro then had syncopal episode ( got dizzy after 2nd nitro).  She called EMS, taken to [facility]. She recalls BP being low. She says that that a head CT scan was okay. She says that an ultrasound of abdomen was okay. This was followed by a CT scan of  abdomen which she says was okay. She had an echo and a stress test and apparently all was normal. Cardiology felt symptoms were GI related.  ? ?She was discharged from Hospital about 8 days ago. She has since had one episode of the discomfort but much milder.  ? ?Nahdia has chronic diarrhea, she takes Lomotil every night. She takes Levsin as  needed for persistent diarrhea. She takes Viberzi about once a month if Lomotil and Levsin are not enough.  ? ? ?Past Medical History:  ?Diagnosis Date  ? Anxiety and depression   ? Cancer [facility])   ? BASAL CELL CARCINOMA ON FACE  ? Celiac disease   ? Colon polyps   ? Eosinophilic esophagitis   ? GERD (gastroesophageal reflux disease)   ? History of basal cell carcinoma   ? Hypothyroidism   ? IBS (irritable bowel syndrome)   ? Status post dilation of esophageal narrowing   ? ? ?Past Surgical History:  ?Procedure Laterality Date  ? COLONOSCOPY  07/02/2013  ? Mild pancolonic diverticulosis. Small internal hemorrhoids. otherise normal.  ? PARATHYROIDECTOMY    ? TONSILLECTOMY    ? UPPER GI ENDOSCOPY  08/06/2007  ? Previously dx Celiac Disease.Risidual pill @ 30cm without any underlying esophageal lesions. S/P biopsies and esophageal dilatation. Esophagus was dilated. Bx: Unremarkable small bowel mucosa with architecturally normal villi, Fungal forms consistent with Candida.  ? UPPER GI ENDOSCOPY  02/25/2017  ? Distal esophageal stricture s/p esophagel dilatation. SI Bx: benign small bowel mucosa. Esophagus Bx: Hyperplastic benign squamous mucosa with chronic inflammation and Eosinophila, focally in excess of 40 eosinophils per high power field, consistent with EOE.  Stomach Bx: Fundic glad polyps in a setting of chronic gastritis.  ? ? ? ? ?Current Medications, Allergies, Family History and Social History were reviewed in [employer] record. ?  ?  ?Current Outpatient Medications  ?Medication Sig Dispense Refill  ? dicyclomine (BENTYL) 10 MG capsule Take 1 capsule (10 mg total) by mouth as needed. 60 capsule 11  ? diphenoxylate-atropine (LOMOTIL) 2.5-0.025 MG tablet TAKE ONE TABLET BY MOUTH EVERY DAY AT BEDTIME 90 tablet 2  ? Eluxadoline 100 MG TABS Take 1 tablet (100 mg total) by mouth daily in the afternoon. 60 tablet 0  ? finasteride (PROSCAR) 5 MG tablet Take 5 mg by mouth daily. HALF A PILL ONCE  A DAY    ? hydroxychloroquine (PLAQUENIL) 200 MG tablet Take 200 mg by mouth 2 (two) times daily.    ? hyoscyamine (LEVSIN) 0.125 MG tablet Take 1-2 tablets every 6-8 hours as needed. 120 tablet 6  ? ketoconazole (NIZORAL) 2 % shampoo Apply to scalp for 5-10 minutes 2-3 times weekly.    ? MAGNESIUM-CALCIUM-FOLIC ACID PO Take by mouth.    ? Multiple Vitamins-Minerals (ZINC PO) Take 1 tablet by mouth daily.    ? Omega 3 340 MG CPDR 1 capsule    ? OVER THE COUNTER MEDICATION Place into both eyes daily. HydroEye drops    ? pantoprazole (PROTONIX) 40 MG tablet Take 1 tablet (40 mg total) by mouth daily. 30 tablet 6  ? tacrolimus (PROTOPIC) 0.1 % ointment Apply to affected areas of scalp three times weekly    ? traZODone (DESYREL) 150 MG tablet Take 150 mg by mouth at bedtime.    ? triamcinolone ointment (KENALOG) 0.1 % Apply topically.    ? VITAMIN D-VITAMIN K PO Take 5,000 Units by mouth daily.    ? ?No current facility-administered medications for this visit.  ? ? ?  Review of Systems:  ?No abdominal pain.  No shortness of breath. No urinary complaints.  ? ? ?Physical Exam  ? ?Wt Readings from Last 3 Encounters:  ?02/13/22 126 lb (57.2 kg)  ?09/13/21 126 lb (57.2 kg)  ?06/20/21 125 lb 8 oz (56.9 kg)  ? ? ?BP 124/68   Pulse 78   Ht 5' 4"  (1.626 m)   Wt 126 lb (57.2 kg)   SpO2 98%   BMI 21.63 kg/m?  ?Constitutional:  Generally well appearing female in no acute distress. ?Psychiatric: Pleasant. Normal mood and affect. Behavior is normal. ?EENT: Pupils normal.  Conjunctivae are normal. No scleral icterus. ?Neck supple.  ?Cardiovascular: Normal rate, regular rhythm. No edema ?Pulmonary/chest: Effort normal and breath sounds normal. No wheezing, rales or rhonchi. ?Abdominal: Soft, nondistended, nontender. Bowel sounds active throughout. There are no masses palpable. No hepatomegaly. ?Neurological: Alert and oriented to person place and time. ?Skin: Skin is warm and dry. No rashes noted. ? ?I spent 30 minutes total  reviewing records, obtaining history, performing exam, counseling patient and documenting visit / findings.  ? ?Tye Savoy, NP  02/13/2022, 1:46 PM ? ? ? ? ? ? ? ? ? ?

## 2022-02-13 NOTE — Patient Instructions (Signed)
We will contact you after speaking with Dr. Lyndel Safe. ? ? ?Thank you for trusting me with your gastrointestinal care!   ? ?Tye Savoy, NP ? ? ? ? ?BMI: ? ?If you are age 73 or older, your body mass index should be between 23-30. Your Body mass index is 21.63 kg/m?Marland Kitchen If this is out of the aforementioned range listed, please consider follow up with your Primary Care Provider. ? ?If you are age 66 or younger, your body mass index should be between 19-25. Your Body mass index is 21.63 kg/m?Marland Kitchen If this is out of the aformentioned range listed, please consider follow up with your Primary Care Provider.  ? ?MY CHART: ? ?The [place] [facility] providers would like to encourage you to use [facility] to communicate with providers for non-urgent requests or questions.  Due to long hold times on the telephone, sending your provider a message by [facility] may be a faster and more efficient way to get a response.  Please allow 48 business hours for a response.  Please remember that this is for non-urgent requests.  ? ?

## 2022-02-13 NOTE — Progress Notes (Signed)
?Cardiology Office Note:   ? ?Date:  02/14/2022  ? ?ID:  Kerry Lewis, DOB 08/03/1951, MRN [id number] ? ?PCP:  Townsend Roger, MD  ?Cardiologist:  Shirlee More, MD   ? ?Referring MD: Townsend Roger, MD  ? ? ?ASSESSMENT:   ? ?1. Chest pain of uncertain etiology   ? ?PLAN:   ? ?In order of problems listed above: ? ?Discussion with the patient I think is a good idea to have a cardiac CTA for 2 reasons.  First is a better test for upfront diagnosis in patients without CAD especially in the female population.  Second she had a normal myocardial perfusion study ongoing symptoms.  Will be set up in a facilitated fashion we will check an EKG before leaving the office ? ? ?Next appointment: 6 weeks ? ? ?Medication Adjustments/Labs and Tests Ordered: ?Current medicines are reviewed at length with the patient today.  Concerns regarding medicines are outlined above.  ?No orders of the defined types were placed in this encounter. ? ?No orders of the defined types were placed in this encounter. ? ? ?Chief Complaint  ?Patient presents with  ? Follow-up  ? ? ?History of Present Illness:   ? ?Kerry Lewis is a 71 y.o. female with a hx of esophageal disease with previous stricture dilation and eosinophilic esophagitis seen by me at [facility] 02/05/2022 with nonexertional chest pain normal troponin EKG. I saw her past [facility] in consultation.  Troponins normal EKG is normal she underwent a treadmill Cardiolite study which was normal perfusion function and normal EKG response at 85% of maximum predicted heart rate and 10.5 METS.  In summary it was a normal low risk evaluation.  I have left things in the hospital that I would not be seeing her in follow-up if her perfusion study was normal.  Unfortunately the hospitalist told her to come back and see me in the office. ? ?Compliance with diet, lifestyle and medications: Yes ? ?She has 2 concerns the first that she saw GI told her that her symptoms were not  esophageal in nature. ?Another episode of chest pain lasted for few hours on Sunday. ?We discussed further evaluation I think she would benefit from a cardiac CTA that was set up facilitated and will give [place] a great deal of information that the presence or absence of CAD calcium score look at her aorta and other thoracic structures. ?She has no dye allergy or renal insufficiency ?Past Medical History:  ?Diagnosis Date  ? Anxiety and depression   ? Cancer [facility])   ? BASAL CELL CARCINOMA ON FACE  ? Celiac disease   ? Colon polyps   ? Eosinophilic esophagitis   ? GERD (gastroesophageal reflux disease)   ? History of basal cell carcinoma   ? Hypothyroidism   ? IBS (irritable bowel syndrome)   ? Status post dilation of esophageal narrowing   ? ? ?Past Surgical History:  ?Procedure Laterality Date  ? COLONOSCOPY  07/02/2013  ? Mild pancolonic diverticulosis. Small internal hemorrhoids. otherise normal.  ? PARATHYROIDECTOMY    ? TONSILLECTOMY    ? UPPER GI ENDOSCOPY  08/06/2007  ? Previously dx Celiac Disease.Risidual pill @ 30cm without any underlying esophageal lesions. S/P biopsies and esophageal dilatation. Esophagus was dilated. Bx: Unremarkable small bowel mucosa with architecturally normal villi, Fungal forms consistent with Candida.  ? UPPER GI ENDOSCOPY  02/25/2017  ? Distal esophageal stricture s/p esophagel dilatation. SI Bx: benign small bowel mucosa. Esophagus Bx: Hyperplastic  benign squamous mucosa with chronic inflammation and Eosinophila, focally in excess of 40 eosinophils per high power field, consistent with EOE.  Stomach Bx: Fundic glad polyps in a setting of chronic gastritis.  ? ? ?Current Medications: ?Current Meds  ?Medication Sig  ? diphenoxylate-atropine (LOMOTIL) 2.5-0.025 MG tablet TAKE ONE TABLET BY MOUTH EVERY DAY AT BEDTIME  ? Eluxadoline 100 MG TABS Take 1 tablet (100 mg total) by mouth daily in the afternoon. (Patient taking differently: Take 1 tablet by mouth daily as needed (Diarrhea /  IBS).)  ? finasteride (PROSCAR) 5 MG tablet Take 5 mg by mouth daily. HALF A PILL ONCE A DAY  ? hydroxychloroquine (PLAQUENIL) 200 MG tablet Take 200 mg by mouth 2 (two) times daily.  ? hyoscyamine (LEVSIN) 0.125 MG tablet Take 1-2 tablets every 6-8 hours as needed. (Patient taking differently: Take 1-2 tablets every 6-8 hours as needed for spasms)  ? ketoconazole (NIZORAL) 2 % shampoo Apply to scalp for 5-10 minutes 2-3 times weekly.  ? MAGNESIUM-CALCIUM-FOLIC ACID PO Take by mouth daily.  ? Multiple Vitamins-Minerals (ZINC PO) Take 1 tablet by mouth daily.  ? Omega 3 340 MG CPDR Take 1 capsule by mouth daily.  ? OVER THE COUNTER MEDICATION Place into both eyes daily. HydroEye drops  ? pantoprazole (PROTONIX) 40 MG tablet Take 1 tablet (40 mg total) by mouth daily.  ? tacrolimus (PROTOPIC) 0.1 % ointment Apply to affected areas of scalp three times weekly  ? traZODone (DESYREL) 150 MG tablet Take 150 mg by mouth at bedtime.  ? triamcinolone ointment (KENALOG) 0.1 % Apply topically daily as needed (itching).  ? VITAMIN D-VITAMIN K PO Take 5,000 Units by mouth daily.  ?  ? ?Allergies:   Dairy aid [tilactase] and Gluten meal  ? ?Social History  ? ?Socioeconomic History  ? Marital status: Married  ?  Spouse name: Not on file  ? Number of children: Not on file  ? Years of education: Not on file  ? Highest education level: Not on file  ?Occupational History  ? Not on file  ?Tobacco Use  ? Smoking status: Never  ?  Passive exposure: Never  ? Smokeless tobacco: Never  ?Vaping Use  ? Vaping Use: Never used  ?Substance and Sexual Activity  ? Alcohol use: No  ?  Alcohol/week: 0.0 standard drinks  ? Drug use: No  ? Sexual activity: Not Currently  ?  Partners: Male  ?  Birth control/protection: Post-menopausal  ?  Comment: 1st intercourse- 16, partners- 1  ?Other Topics Concern  ? Not on file  ?Social History Narrative  ? Not on file  ? ?Social Determinants of Health  ? ?Financial Resource Strain: Not on file  ?Food Insecurity:  Not on file  ?Transportation Needs: Not on file  ?Physical Activity: Not on file  ?Stress: Not on file  ?Social Connections: Not on file  ?  ? ?Family History: ?The patient's family history includes Breast cancer in her paternal aunt; Colon cancer in her maternal uncle; Diabetes in her paternal aunt; Esophageal cancer in her father; Heart disease in her sister. There is no history of Stomach cancer or Pancreatic cancer. ?ROS:   ?Please see the history of present illness.    ?All other systems reviewed and are negative. ? ?EKGs/Labs/Other Studies Reviewed:   ? ?The following studies were reviewed today: ? ? ? ?Physical Exam:   ? ?VS:  BP 128/72 (BP Location: Left Arm)   Pulse 80   Ht 5' 4"  (1.626  m)   Wt 126 lb 6.4 oz (57.3 kg)   SpO2 98%   BMI 21.70 kg/m?    ? ?Wt Readings from Last 3 Encounters:  ?02/14/22 126 lb 6.4 oz (57.3 kg)  ?02/13/22 126 lb (57.2 kg)  ?09/13/21 126 lb (57.2 kg)  ?  ? ?GEN:  Well nourished, well developed in no acute distress ?HEENT: Normal ?NECK: No JVD; No carotid bruits ?LYMPHATICS: No lymphadenopathy ?CARDIAC: 2RRR, no murmurs, rubs, gallops ?RESPIRATORY:  Clear to auscultation without rales, wheezing or rhonchi  ?ABDOMEN: Soft, non-tender, non-distended ?MUSCULOSKELETAL:  No edema; No deformity  ?SKIN: Warm and dry ?NEUROLOGIC:  Alert and oriented x 3 ?PSYCHIATRIC:  Normal affect  ? ? ?Signed, ?Shirlee More, MD  ?02/14/2022 1:12 PM    ?[place]  ?

## 2022-02-14 ENCOUNTER — Ambulatory Visit: Payer: PPO | Admitting: Cardiology

## 2022-02-14 ENCOUNTER — Encounter: Payer: Self-pay | Admitting: Cardiology

## 2022-02-14 VITALS — BP 128/72 | HR 80 | Ht 64.0 in | Wt 126.4 lb

## 2022-02-14 DIAGNOSIS — R079 Chest pain, unspecified: Secondary | ICD-10-CM | POA: Diagnosis not present

## 2022-02-14 MED ORDER — METOPROLOL TARTRATE 100 MG PO TABS: 100.0000 mg | ORAL_TABLET | Freq: Once | ORAL | 0 refills | Status: DC

## 2022-02-14 NOTE — Patient Instructions (Addendum)
Medication Instructions:  ?Your physician recommends that you continue on your current medications as directed. Please refer to the Current Medication list given to you today. ? ?*If you need a refill on your cardiac medications before your next appointment, please call your pharmacy* ? ? ?Lab Work: ? ?Labs 1 week before CT: BMP ? ?If you have labs (blood work) drawn today and your tests are completely normal, you will receive your results only by: ?MyChart Message (if you have MyChart) OR ?A paper copy in the mail ?If you have any lab test that is abnormal or we need to change your treatment, we will call you to review the results. ? ? ?Testing/Procedures: ? ? ?Your cardiac CT will be scheduled at one of the below locations:  ? ?[facility] ?[address] ?[place], [place] [address] ?[phone] ? ?OR ? ?[place] [address] ?[place], [place] [address] ?[phone] ?If scheduled at [facility], please arrive at the [facility] and Children's Entrance (Entrance C2) of [facility] 30 minutes prior to test start time. ?You can use the FREE valet parking offered at entrance C (encouraged to control the heart rate for the test)  ?Proceed to the [facility] Radiology Department (first floor) to check-in and test prep. ? ?All radiology patients and guests should use entrance C2 at [facility], accessed from [facility], even though the hospital's physical address listed is [address] ? ? ? ?If scheduled at [facility], please arrive 15 mins early for check-in and test prep. ? ?On the Night Before the Test: ?Be sure to Drink plenty of water. ?Do not consume any caffeinated/decaffeinated beverages or chocolate 12 hours prior to your test. ?Do not take any antihistamines 12 hours prior to your test. ? ?On the Day of the Test: ?Drink plenty of water until 1 hour prior to the test. ?Do  not eat any food 4 hours prior to the test. ?You may take your regular medications prior to the test.  ?Take metoprolol (Lopressor) two hours prior to test. ?FEMALES- please wear underwire-free bra if available, avoid dresses & tight clothing ?     ?After the Test: ?Drink plenty of water. ?After receiving IV contrast, you may experience a mild flushed feeling. This is normal. ?On occasion, you may experience a mild rash up to 24 hours after the test. This is not dangerous. If this occurs, you can take Benadryl 25 mg and increase your fluid intake. ?If you experience trouble breathing, this can be serious. If it is severe call 911 IMMEDIATELY. If it is mild, please call our office. ? ?We will call to schedule your test 2-4 weeks out understanding that some insurance companies will need an authorization prior to the service being performed.  ? ?For non-scheduling related questions, please contact the cardiac imaging nurse navigator should you have any questions/concerns: ?Marchia Bond, Cardiac Imaging Nurse Navigator ?Gordy Clement, Cardiac Imaging Nurse Navigator ?[facility] ?Direct Office Dial: [phone]  ? ?For scheduling needs, including cancellations and rescheduling, please call [place], [phone]. ? ? ? ?Follow-Up: ?At [facility], you and your health needs are our priority.  As part of our continuing mission to provide you with exceptional heart care, we have created designated Provider Care Teams.  These Care Teams include your primary Cardiologist (physician) and Advanced Practice Providers (APPs -  Physician Assistants and Nurse Practitioners) who all work together  to provide you with the care you need, when you need it. ? ?We recommend signing up for the patient portal called "MyChart".  Sign up information is provided on this After Visit Summary.  MyChart is used to connect with patients for Virtual Visits (Telemedicine).  Patients are able to view lab/test results,  encounter notes, upcoming appointments, etc.  Non-urgent messages can be sent to your provider as well.   ?To learn more about what you can do with MyChart, go to [email or web address].   ? ?Your next appointment:   ?6 week(s) ? ?The format for your next appointment:   ?In Person ? ?Provider:   ?Shirlee More, MD  ? ? ?Other Instructions ?None ? ?Important Information About Sugar ? ? ? ? ? ? ?

## 2022-02-14 NOTE — Progress Notes (Signed)
Agree with assessment/plan ?Lets proceed with EGD but after cardiac evaluation. ?RG ?

## 2022-02-16 ENCOUNTER — Telehealth: Payer: Self-pay

## 2022-02-16 NOTE — Telephone Encounter (Signed)
Spoke with pt and gave pt message. Pt states she will have cardiac evaluation on 4/24 and will call the office after that. Reminder in epic.  ?

## 2022-02-16 NOTE — Telephone Encounter (Signed)
-----   Message from Willia Craze, NP sent at 02/15/2022  5:24 PM EDT ----- ?Mickel Baas, please let patient know that Dr. Lyndel Safe would like to proceed with the EGD but wants to wait until after cardiac evaluation.  After her cardiac testing is complete  ask her to call the office and we will review cardiology's notes and go from there.  Thanks ? ?

## 2022-02-19 DIAGNOSIS — L820 Inflamed seborrheic keratosis: Secondary | ICD-10-CM | POA: Diagnosis not present

## 2022-02-19 DIAGNOSIS — L821 Other seborrheic keratosis: Secondary | ICD-10-CM | POA: Diagnosis not present

## 2022-02-19 DIAGNOSIS — D225 Melanocytic nevi of trunk: Secondary | ICD-10-CM | POA: Diagnosis not present

## 2022-02-19 DIAGNOSIS — L538 Other specified erythematous conditions: Secondary | ICD-10-CM | POA: Diagnosis not present

## 2022-02-19 DIAGNOSIS — L814 Other melanin hyperpigmentation: Secondary | ICD-10-CM | POA: Diagnosis not present

## 2022-02-20 DIAGNOSIS — R079 Chest pain, unspecified: Secondary | ICD-10-CM | POA: Diagnosis not present

## 2022-02-21 DIAGNOSIS — R0789 Other chest pain: Secondary | ICD-10-CM | POA: Diagnosis not present

## 2022-02-21 LAB — BASIC METABOLIC PANEL
BUN/Creatinine Ratio: 17 (ref 12–28)
BUN: 12 mg/dL (ref 8–27)
CO2: 23 mmol/L (ref 20–29)
Calcium: 9.2 mg/dL (ref 8.7–10.3)
Chloride: 101 mmol/L (ref 96–106)
Creatinine, Ser: 0.7 mg/dL (ref 0.57–1.00)
Glucose: 85 mg/dL (ref 70–99)
Potassium: 4.2 mmol/L (ref 3.5–5.2)
Sodium: 140 mmol/L (ref 134–144)
eGFR: 92 mL/min/{1.73_m2} (ref >59)

## 2022-02-23 ENCOUNTER — Telehealth (HOSPITAL_COMMUNITY): Payer: Self-pay | Admitting: *Deleted

## 2022-02-23 NOTE — Telephone Encounter (Signed)
Reaching out to patient to offer assistance regarding upcoming cardiac imaging study; pt verbalizes understanding of appt date/time, parking situation and where to check in, pre-test NPO status and medications ordered, and verified current allergies; name and call back number provided for further questions should they arise ? ?Gordy Clement RN Navigator Cardiac Imaging ?[facility] ?[phone] office ?[phone] cell ? ?Patient to take 138m metoprolol tartrate two hours prior to her cardiac CT scan.  She is aware to arrive at 1:30pm. ? ?

## 2022-02-23 NOTE — Telephone Encounter (Signed)
Attempted to call patient regarding upcoming cardiac CT appointment. °Left message on voicemail with name and callback number ° °Teralyn Mullins RN Navigator Cardiac Imaging °[facility] °[phone] Office °[phone] Cell ° °

## 2022-02-26 ENCOUNTER — Ambulatory Visit (HOSPITAL_COMMUNITY)
Admission: RE | Admit: 2022-02-26 | Discharge: 2022-02-26 | Disposition: A | Discharge status: Home / Self Care | Payer: PPO | Source: Ambulatory Visit | Attending: Cardiology | Admitting: Cardiology

## 2022-02-26 DIAGNOSIS — I700 Atherosclerosis of aorta: Secondary | ICD-10-CM | POA: Insufficient documentation

## 2022-02-26 DIAGNOSIS — R079 Chest pain, unspecified: Secondary | ICD-10-CM

## 2022-02-26 MED ORDER — NITROGLYCERIN 0.4 MG SL SUBL
SUBLINGUAL_TABLET | SUBLINGUAL | Status: AC
  Administered 2022-02-26: 0.8 mg via SUBLINGUAL
  Filled 2022-02-26: qty 2

## 2022-02-26 MED ORDER — NITROGLYCERIN 0.4 MG SL SUBL: 0.8000 mg | SUBLINGUAL_TABLET | Freq: Once | SUBLINGUAL | Status: AC

## 2022-02-26 MED ORDER — IOHEXOL 350 MG/ML SOLN
100.0000 mL | Freq: Once | INTRAVENOUS | Status: AC | PRN
  Administered 2022-02-26: 100 mL via INTRAVENOUS

## 2022-02-27 ENCOUNTER — Telehealth: Payer: Self-pay

## 2022-02-27 NOTE — Telephone Encounter (Signed)
Patient notified of results.

## 2022-02-27 NOTE — Telephone Encounter (Signed)
-----   Message from Richardo Priest, MD sent at 02/27/2022 10:26 AM EDT ----- ?Great result her cardiac CTA is normal ?

## 2022-02-28 NOTE — Telephone Encounter (Signed)
It looks like pt had cardiac testing.  ?

## 2022-03-01 NOTE — Telephone Encounter (Signed)
Patient called back and is ready to go ahead and schedule procedure with Dr. Lyndel Safe. ?

## 2022-03-02 ENCOUNTER — Other Ambulatory Visit: Payer: Self-pay

## 2022-03-02 DIAGNOSIS — R1310 Dysphagia, unspecified: Secondary | ICD-10-CM

## 2022-03-02 DIAGNOSIS — K200 Eosinophilic esophagitis: Secondary | ICD-10-CM

## 2022-03-02 NOTE — Telephone Encounter (Signed)
Attempted to schedule pt with Dr. Lyndel Safe in the [facility] on 5/24 but pt stated she will be on vacation. Pt wanted a morning appt. Pt scheduled for first available morning appt on 04/16/22 at 10:00 am. Instructions sent to pt's mychart and mailed. Ambulatory referral to GI placed. Pt wanted to let provider know that she is still having pain.  ?

## 2022-03-28 ENCOUNTER — Other Ambulatory Visit: Payer: Self-pay

## 2022-03-28 DIAGNOSIS — K900 Celiac disease: Secondary | ICD-10-CM | POA: Insufficient documentation

## 2022-04-03 NOTE — Progress Notes (Unsigned)
Cardiology Office Note:    Date:  04/04/2022   ID:  Kerry Lewis, DOB [birth year], MRN [id number]  PCP:  Townsend Roger, MD  Cardiologist:  Shirlee More, MD    Referring MD: Townsend Roger, MD    ASSESSMENT:    1. Chest pain, unspecified type   2. Status post dilation of esophageal narrowing    PLAN:    In order of problems listed above:  Not only is her cardiac CTA normal and her coronary calcium score is 0 at very low risk for cardiac events in the next 5 to 7 years I do not think she requires any further cardiology care she does not take an aspirin.  I told her it is optional whether or not to take a statin there is no clear-cut benefit in the prescription that individuals. She is improved taking a PPI   Next appointment: I will see her back in the office as needed   Medication Adjustments/Labs and Tests Ordered: Current medicines are reviewed at length with the patient today.  Concerns regarding medicines are outlined above.  No orders of the defined types were placed in this encounter.  No orders of the defined types were placed in this encounter.   Chief complaint follow-up after cardiac CTA   History of Present Illness:    Kerry Lewis is a 71 y.o. female with a hx of esophageal disease with previous stricture dilation and eosinophilic esophagitis seen by me at [facility] 02/05/2022 with nonexertional chest pain normal troponin EKG. I saw her past [facility] in consultation.  Troponins normal EKG is normal she underwent a treadmill Cardiolite study which was normal perfusion function and normal EKG response at 85% of maximum predicted heart rate and 10.5 METS.  In summary it was a normal low risk evaluation  last seen 02/14/2022  Compliance with diet, lifestyle and medications: Yes  I reviewed the results of her cardiac CTA her calcium score of 0 normal coronary arteries She has been in my opinion of statin usage I told her in this  situation there is no clear-cut benefit and she is at very low risk over the next 5 to 7 years She has had no further trouble swallowing esophageal pain or chest pain he is considering canceling her pending endoscopy because of the pressure  Her husband at home with worsening dementia  Cardiac CTA 02/26/2022: IMPRESSION: 1. Coronary calcium score of 0.   2. Normal coronary origin with right dominance.   3. No evidence of CAD.   CAD-RADS 0. No evidence of CAD (0%).  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 Small internal hemorrhoids. otherise normal.  PARATHYROIDECTOMY     TONSILLECTOMY     UPPER GI ENDOSCOPY  08/06/2007   Previously dx Celiac Disease.Risidual pill @ 30cm without any underlying esophageal lesions. S/P biopsies and esophageal dilatation. Esophagus was dilated. Bx: Unremarkable small bowel mucosa with architecturally normal villi, Fungal forms consistent with Candida.   UPPER GI ENDOSCOPY  02/25/2017   Distal esophageal stricture s/p esophagel dilatation. SI Bx: benign small bowel mucosa. Esophagus Bx: Hyperplastic benign squamous mucosa with chronic inflammation and Eosinophila, focally in excess of 40 eosinophils per high power  field, consistent with EOE.  Stomach Bx: Fundic glad polyps in a setting of chronic gastritis.    Current Medications: No outpatient medications have been marked as taking for the 04/04/22 encounter (Office Visit) with Richardo Priest, MD.     Allergies:   Dairy aid [tilactase] and Gluten meal   Social History   Socioeconomic History   Marital status: Married    Spouse name: Not on file   Number of children: Not on file   Years of education: Not on file   Highest education level: Not on file  Occupational History   Not on file  Tobacco Use   Smoking status: Never    Passive exposure: Never   Smokeless tobacco: Never  Vaping Use   Vaping Use: Never used  Substance and Sexual Activity   Alcohol use: No    Alcohol/week: 0.0 standard drinks   Drug use: No   Sexual activity: Not Currently    Partners: Male    Birth control/protection: Post-menopausal    Comment: 1st intercourse- 16, partners- 1  Other Topics Concern   Not on file  Social History Narrative   Not on file   Social Determinants of Health   Financial Resource Strain: Not on file  Food Insecurity: Not on file  Transportation Needs: Not on file  Physical Activity: Not on file  Stress: Not on file  Social Connections: Not on file     Family History: The patient's family history includes Breast cancer in her paternal aunt; Colon cancer in her maternal uncle; Diabetes in her paternal aunt; Esophageal cancer in her father; Heart disease in her sister. There is no history of Stomach cancer or Pancreatic cancer. ROS:   Please see the history of present illness.    All other systems reviewed and are negative.  EKGs/Labs/Other Studies Reviewed:    The following studies were reviewed today:   Recent Labs: 02/20/2022: BUN 12; Creatinine, Ser 0.70; Potassium 4.2; Sodium 140  Recent Lipid Panel 10/23/2021 cholesterol 211 LDL 107 HDL 96 triglycerides 43  Physical Exam:    VS:  BP 112/70   Pulse 80   Ht 5' 4"   (1.626 m)   Wt 125 lb 3.2 oz (56.8 kg)   SpO2 98%   BMI 21.49 kg/m     Wt Readings from Last 3 Encounters:  04/04/22 125 lb 3.2 oz (56.8 kg)  02/14/22 126 lb 6.4 oz (57.3 kg)  02/13/22 126 lb (57.2 kg)     GEN:  Well nourished, well developed in no acute distress HEENT: Normal NECK: No JVD; No carotid bruits LYMPHATICS: No lymphadenopathy CARDIAC: RRR, no murmurs, rubs, gallops RESPIRATORY:  Clear to auscultation without rales, wheezing or rhonchi  ABDOMEN: Soft, non-tender, non-distended MUSCULOSKELETAL:  No edema; No deformity  SKIN: Warm and dry NEUROLOGIC:  Alert and oriented x 3 PSYCHIATRIC:  Normal affect    Signed, Shirlee More, MD  04/04/2022 11:23 AM    [facility]

## 2022-04-04 ENCOUNTER — Encounter: Payer: Self-pay | Admitting: Cardiology

## 2022-04-04 ENCOUNTER — Ambulatory Visit: Payer: PPO | Admitting: Cardiology

## 2022-04-04 VITALS — BP 112/70 | HR 80 | Ht 64.0 in | Wt 125.2 lb

## 2022-04-04 DIAGNOSIS — Z9889 Other specified postprocedural states: Secondary | ICD-10-CM | POA: Diagnosis not present

## 2022-04-04 DIAGNOSIS — Z8719 Personal history of other diseases of the digestive system: Secondary | ICD-10-CM | POA: Diagnosis not present

## 2022-04-04 DIAGNOSIS — R079 Chest pain, unspecified: Secondary | ICD-10-CM

## 2022-04-04 NOTE — Patient Instructions (Signed)
Medication Instructions:  Your physician recommends that you continue on your current medications as directed. Please refer to the Current Medication list given to you today.  *If you need a refill on your cardiac medications before your next appointment, please call your pharmacy*   Lab Work: None If you have labs (blood work) drawn today and your tests are completely normal, you will receive your results only by: [place] (if you have MyChart) OR A paper copy in the mail If you have any lab test that is abnormal or we need to change your treatment, we will call you to review the results.   Testing/Procedures: None   Follow-Up: At [facility], you and your health needs are our priority.  As part of our continuing mission to provide you with exceptional heart care, we have created designated Provider Care Teams.  These Care Teams include your primary Cardiologist (physician) and Advanced Practice Providers (APPs -  Physician Assistants and Nurse Practitioners) who all work together to provide you with the care you need, when you need it.  We recommend signing up for the patient portal called "MyChart".  Sign up information is provided on this After Visit Summary.  MyChart is used to connect with patients for Virtual Visits (Telemedicine).  Patients are able to view lab/test results, encounter notes, upcoming appointments, etc.  Non-urgent messages can be sent to your provider as well.   To learn more about what you can do with MyChart, go to [email or web address].    Your next appointment:   Follow up as needed  The format for your next appointment:   In Person  Provider:   Shirlee More, MD    Other Instructions None  Important Information About Sugar

## 2022-04-16 ENCOUNTER — Encounter: Payer: PPO | Admitting: Gastroenterology

## 2022-04-23 DIAGNOSIS — E7800 Pure hypercholesterolemia, unspecified: Secondary | ICD-10-CM | POA: Diagnosis not present

## 2022-04-30 DIAGNOSIS — D485 Neoplasm of uncertain behavior of skin: Secondary | ICD-10-CM | POA: Diagnosis not present

## 2022-04-30 DIAGNOSIS — L988 Other specified disorders of the skin and subcutaneous tissue: Secondary | ICD-10-CM | POA: Diagnosis not present

## 2022-04-30 DIAGNOSIS — L820 Inflamed seborrheic keratosis: Secondary | ICD-10-CM | POA: Diagnosis not present

## 2022-06-01 ENCOUNTER — Other Ambulatory Visit: Payer: Self-pay | Admitting: Obstetrics & Gynecology

## 2022-06-01 DIAGNOSIS — Z1231 Encounter for screening mammogram for malignant neoplasm of breast: Secondary | ICD-10-CM

## 2022-07-04 ENCOUNTER — Ambulatory Visit: Payer: PPO

## 2022-07-11 ENCOUNTER — Ambulatory Visit
Admission: RE | Admit: 2022-07-11 | Discharge: 2022-07-11 | Disposition: A | Discharge status: Home / Self Care | Payer: PPO | Source: Ambulatory Visit | Attending: Obstetrics & Gynecology | Admitting: Obstetrics & Gynecology

## 2022-07-11 DIAGNOSIS — Z1231 Encounter for screening mammogram for malignant neoplasm of breast: Secondary | ICD-10-CM | POA: Diagnosis not present

## 2022-07-12 DIAGNOSIS — M7752 Other enthesopathy of left foot: Secondary | ICD-10-CM | POA: Insufficient documentation

## 2022-08-02 DIAGNOSIS — M7752 Other enthesopathy of left foot: Secondary | ICD-10-CM | POA: Diagnosis not present

## 2022-08-22 DIAGNOSIS — L814 Other melanin hyperpigmentation: Secondary | ICD-10-CM | POA: Diagnosis not present

## 2022-08-22 DIAGNOSIS — L570 Actinic keratosis: Secondary | ICD-10-CM | POA: Diagnosis not present

## 2022-08-22 DIAGNOSIS — C44622 Squamous cell carcinoma of skin of right upper limb, including shoulder: Secondary | ICD-10-CM | POA: Diagnosis not present

## 2022-08-22 DIAGNOSIS — D225 Melanocytic nevi of trunk: Secondary | ICD-10-CM | POA: Diagnosis not present

## 2022-08-22 DIAGNOSIS — L821 Other seborrheic keratosis: Secondary | ICD-10-CM | POA: Diagnosis not present

## 2022-08-22 DIAGNOSIS — D485 Neoplasm of uncertain behavior of skin: Secondary | ICD-10-CM | POA: Diagnosis not present

## 2022-09-10 DIAGNOSIS — L658 Other specified nonscarring hair loss: Secondary | ICD-10-CM | POA: Insufficient documentation

## 2022-09-10 DIAGNOSIS — L661 Lichen planopilaris: Secondary | ICD-10-CM | POA: Diagnosis not present

## 2022-09-10 DIAGNOSIS — L089 Local infection of the skin and subcutaneous tissue, unspecified: Secondary | ICD-10-CM | POA: Diagnosis not present

## 2022-09-10 DIAGNOSIS — Z79899 Other long term (current) drug therapy: Secondary | ICD-10-CM | POA: Diagnosis not present

## 2022-09-10 DIAGNOSIS — Z79621 Long term (current) use of calcineurin inhibitor: Secondary | ICD-10-CM | POA: Diagnosis not present

## 2022-09-10 DIAGNOSIS — L988 Other specified disorders of the skin and subcutaneous tissue: Secondary | ICD-10-CM | POA: Insufficient documentation

## 2022-09-10 DIAGNOSIS — Z5181 Encounter for therapeutic drug level monitoring: Secondary | ICD-10-CM | POA: Diagnosis not present

## 2022-09-25 DIAGNOSIS — H0288B Meibomian gland dysfunction left eye, upper and lower eyelids: Secondary | ICD-10-CM | POA: Diagnosis not present

## 2022-09-25 DIAGNOSIS — H0288A Meibomian gland dysfunction right eye, upper and lower eyelids: Secondary | ICD-10-CM | POA: Diagnosis not present

## 2022-09-25 DIAGNOSIS — H16223 Keratoconjunctivitis sicca, not specified as Sjogren's, bilateral: Secondary | ICD-10-CM | POA: Diagnosis not present

## 2022-10-05 ENCOUNTER — Ambulatory Visit: Payer: PPO | Admitting: Obstetrics & Gynecology

## 2022-10-16 DIAGNOSIS — L989 Disorder of the skin and subcutaneous tissue, unspecified: Secondary | ICD-10-CM | POA: Diagnosis not present

## 2022-10-16 DIAGNOSIS — L570 Actinic keratosis: Secondary | ICD-10-CM | POA: Diagnosis not present

## 2022-10-16 DIAGNOSIS — C44622 Squamous cell carcinoma of skin of right upper limb, including shoulder: Secondary | ICD-10-CM | POA: Diagnosis not present

## 2022-10-16 DIAGNOSIS — Z09 Encounter for follow-up examination after completed treatment for conditions other than malignant neoplasm: Secondary | ICD-10-CM | POA: Diagnosis not present

## 2022-10-16 DIAGNOSIS — L578 Other skin changes due to chronic exposure to nonionizing radiation: Secondary | ICD-10-CM | POA: Diagnosis not present

## 2022-10-17 ENCOUNTER — Encounter: Payer: Self-pay | Admitting: Internal Medicine

## 2022-10-17 ENCOUNTER — Ambulatory Visit: Payer: PPO | Admitting: Internal Medicine

## 2022-10-17 VITALS — BP 140/72 | HR 77 | Temp 97.7°F | Resp 16 | Ht 64.0 in | Wt 127.0 lb

## 2022-10-17 DIAGNOSIS — E7800 Pure hypercholesterolemia, unspecified: Secondary | ICD-10-CM | POA: Insufficient documentation

## 2022-10-17 DIAGNOSIS — K200 Eosinophilic esophagitis: Secondary | ICD-10-CM

## 2022-10-17 DIAGNOSIS — K219 Gastro-esophageal reflux disease without esophagitis: Secondary | ICD-10-CM

## 2022-10-17 DIAGNOSIS — K900 Celiac disease: Secondary | ICD-10-CM

## 2022-10-17 DIAGNOSIS — E079 Disorder of thyroid, unspecified: Secondary | ICD-10-CM

## 2022-10-17 DIAGNOSIS — Z85828 Personal history of other malignant neoplasm of skin: Secondary | ICD-10-CM

## 2022-10-17 DIAGNOSIS — M8589 Other specified disorders of bone density and structure, multiple sites: Secondary | ICD-10-CM

## 2022-10-17 DIAGNOSIS — L661 Lichen planopilaris: Secondary | ICD-10-CM

## 2022-10-17 DIAGNOSIS — Z6821 Body mass index (BMI) 21.0-21.9, adult: Secondary | ICD-10-CM

## 2022-10-17 DIAGNOSIS — K580 Irritable bowel syndrome with diarrhea: Secondary | ICD-10-CM

## 2022-10-17 DIAGNOSIS — E538 Deficiency of other specified B group vitamins: Secondary | ICD-10-CM

## 2022-10-17 DIAGNOSIS — E559 Vitamin D deficiency, unspecified: Secondary | ICD-10-CM

## 2022-10-17 MED ORDER — DIPHENOXYLATE-ATROPINE 2.5-0.025 MG PO TABS: 1.0000 | ORAL_TABLET | Freq: Every day | ORAL | 2 refills | Status: DC

## 2022-10-17 MED ORDER — PNEUMOCOCCAL 20-VAL CONJ VACC 0.5 ML IM SUSY: 0.5000 mL | PREFILLED_SYRINGE | INTRAMUSCULAR | 0 refills | Status: AC

## 2022-10-17 NOTE — Assessment & Plan Note (Signed)
Continue diet and exercise for her HCL.

## 2022-10-17 NOTE — Assessment & Plan Note (Signed)
She has a healthy lifestyle and is to keep active.

## 2022-10-17 NOTE — Assessment & Plan Note (Signed)
She has osteopenia.  Her exercise is appropirate.  We will repear bone density in 1-2 years.

## 2022-10-17 NOTE — Assessment & Plan Note (Signed)
Her GERD is controlled on her PPI.

## 2022-10-17 NOTE — Assessment & Plan Note (Signed)
Continue her current meds for alopecia.

## 2022-10-17 NOTE — Assessment & Plan Note (Signed)
She eats appropriately for this disease state.

## 2022-10-17 NOTE — Assessment & Plan Note (Signed)
We stopped her Vit B 12 supplementation this year and we will repeat her studies.

## 2022-10-17 NOTE — Progress Notes (Unsigned)
Preventive Screening-Counseling & Management     Kerry Lewis is a 71 y.o. female who presents for Medicare Annual/Subsequent preventive examination.  Her last eye exam was in 12/2021 and she states her vision is doing well.  Her last colonoscopy was on 07/03/2019 and this showed mild pancolonic diverticulosis and small internal hemorrhoids but was otherwise normal.  Her last mammogram was done on 07/11/2022 and this was normal.  The patient does exercise 7 days a week with treadmill and strength training.  The patient denies any problems with urination.  She does get yearly flu vaccines.  She had a pneumovax 23 vaccine around 71 years of age.  She has never had a Prevnar 13 vaccine.  She has had a shingrix vaccine.  She has had 4 COVID-19 vaccines including 2 boosters.  She is not interested in the RSV vaccine.  There is no family history of CAD or stroke.  There is no worsening depression or  anxiety and no memory loss.  She is not on an ASA 45m daily.    The patient is a 71year old Caucasian/White female who presents to establish care. PMH includes Celiac disease, IBS (both C and D), Eosinophilic Esophagitis, Alopecia, Thyroid disease and Osteopenia and Insomnia. She was diagnosed with celiac disease in 2001 and has followed a gluten free diet. This has been effective for mgt. She follows with Dr GLyndel SafeGI for her IBS, GERD and Eosinophilic Esophagitis and reports that she has colonoscopies q3 years. She denies significant changes in her bowel patterns. She had an EGD in 2018 which indicated the Eosinophilic Esophagitis. Her symptoms are triggered by eating chicken and hamburger. She is managed with Protonix 40 mg daily. She also has insomnia and sleeps about 6hrs q HS with use of Trazodone,. She has issues staying asleep. She has been following with Endocrinology for her Alopecia and she is treated with Finasteride and Hydroxychloroquine. She takes Vit D supplement and does weight bearing exercise  for mgt. She had bone density with het GYN. She is interested in getting her annual labs today before the year ends. She has no current symptoms. The patient denies palpitations, bilateral lower extremity edema, left leg edema, right leg edema, fever, chills, nasal congestion, fatigue, malaise, weight loss, weight gain, hematemesis, rectal bleeding, an anxious mood, and a depressed mood. Medications are listed in the medication list. The patient has not undergone any recent lab work, imaging studies or tests since the last visit.   [name] a 71yo female who earlier this year had chest pain.  I had sent her to cardiology but ultimately she felt to have eosinophilic esophagitis (EOE).  She did undergo a coronary CT angiogram.  [place] [facility] [facility] with tightness.  There was no associated SOB, palpitations, n/v, diaphoresis, presyncope or other problems.  This was occurring daily about 2-3 times per day.  She has a history of GERD and eosinophilic esophagitis but she denies any heartburn or other symptoms at that time.  I saw her on 01/31/2022 and did an ekg at that time that showed NSR with ST/T wave changes.  I spoke to Dr. MBettina Gaviaduring that visit and arranged for her to go that coming Monday about 4-5 days later to be seen in his office.  However, during that weekend on 02/04/2022 she began having the chest tightness again and took NTG.  It did not go away and she took a 2nd dose and fell to the  floor.  911 was initiated and she was brought to the ER where an EKG showed she had NSR without ST/T wave changes.  She had negative serial cardiac markers and they also did labs which showed an elevated lipase.  The patient underwent a CT scan of her head which was unremarkable.  They did a RUQ that was unremarkable and then did a CT scan of her abd/pelvis which was also unremarkable.  The patient was admitted to the hospital at that time and was discharged the next day on 02/05/2022.  Her lipase trended  down.  She underwent a treadmill cardiolyte stress test on 02/05/2022 which was negative for ischemia.  Cardiology saw her and they felt her chest tightness and burning in her jaw was due possibly to her eosinophilic esophagitis.  She went to go see her GI doc over the interim on 02/13/2022.  They were not convinced it was her esophagitis and wanted to wait until she had her coronary CT angiogram done by cardiology first.  The patient underwent coronary CT angiogram with a calcium score of 0 with a normal coronary origin and no evidence of coronary artery disease.  Dr. Lyndel Safe diagnosed her with EOE about 4 year ago. She had a distal esophageal stricture which was dilated.  She denies any dysphagia at this time. She was then treated with a steroid inhaler for 6 weeks. She is on a daily PPI. She was diagnosed with Celiac disease in 2001. She had an EGD done on 02/25/2017 which showed a distal esophageal stricture s/p dilatation with pathology consistent on her biopsy of EOE.  She has a history of Kerry Lewis has chronic diarrhea where she takes levisin.  She was on viberzi about once a month but uses Lomotil as needed.  She has IBS that is relatively controlled and has GERD and this is controlled as above on her PPI.  The patient is a 71 year old Caucasian/White female , post-menopausal, who presents for followup of her osteopenia.. She has no specific complaints related to bone loss. She denies a family history of osteoporosis. There are no risk factors. She denies the following: smoking, diabetes mellitus, high caffeine intake, alcohol consumption of more that 7 ounces per week, daily prednisone use, hyperthyroidism, surgical resection of her bowel, and surgical resection of her stomach. She states she exercises routinely. A bone mineral density study on 06/09/2021 and this showed a t-score of -2.3. She has a Vitamin D deficiency which was elevated and we stopped her off her Vitamin D last year. She did take Boniva for about  3 years in the past and took iv reclast x 1.   The patient returns today for routine followup on her cholesterol. The patient was diagnosed last year with hypercholesterolemia. Overall, she states she is doing well and is without any complaints or problems at this time. She specifically denies abdominal pain, nausea, vomiting, diarrhea, myalgias, and fatigue. She remains on dietary management as well as a regular exercise program She last ate less than 4-6 hours ago so we will have to hold on lab draws today.   She also has a history of hypothyroidism where she was told she had that in the early 90's.  She is currently not on any medications for this.  She denies any heat/cold intolerance, unexplained weight loss gain, tremors, anxiety or other problems.  The patient returns for folllowup of her history of depression and anxiety. Today, the patient denies every having depression or anxiety. She does  have have a history of insomnia that has been occurring for years with difficulty falling to sleep. She is currently on trazodone 116m po qhs. She reports no additional symptoms She denies difficulty concentrating, difficulty performing routine daily activities, extreme feelings of guilt, feelings of isolation, feelings of worthlessness, helpless feeling, suicidal ideation, homicidal ideation, weight loss, loss of appetite, social withdrawal, out of control feelings, and panic attacks. This patient feels that she is able to care for herself. She currently lives with her husband. She has no significant prior history of mental health disorders.   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  She is on proscar and plaquenil for her alopecia where she states this is stable.  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     Are there smokers in your home (other than  you)? No  Risk Factors Current exercise habits:  see above   Dietary issues discussed: none   Depression Screen (Note: if answer to either of the following is "Yes", a more complete depression screening is indicated)   Over the past two weeks, have you felt down, depressed or hopeless? No  Over the past two weeks, have you felt little interest or pleasure in doing things? No  Have you lost interest or pleasure in daily life? No  Do you often feel hopeless? No  Do you cry easily over simple problems? No  Activities of Daily Living In your present state of health, do you have any difficulty performing the following activities?:  Driving? No Managing money?  No Feeding yourself? No Getting from bed to chair? No Climbing a flight of stairs? No Preparing food and eating?: No Bathing or showering? No Getting dressed: No Getting to the toilet? No Using the toilet:No Moving around from place to place: No In the past year have you fallen or had a near fall?:No   Are you sexually active?  No  Do you have more than one partner?  No  Hearing Difficulties: No Do you often ask people to speak up or repeat themselves? No Do you experience ringing or noises in your ears? No Do you have difficulty understanding soft or whispered voices? No   Do you feel that you have a problem with memory? No  Do you often misplace items? No  Do you feel safe at home?  Yes  Cognitive Testing  Alert? Yes  Normal Appearance?Yes  Oriented to person? Yes  Place? Yes   Time? Yes  Recall of three objects?  Yes  Can perform simple calculations? Yes  Displays appropriate judgment?Yes  Can read the correct time from a watch face?Yes  Fall Risk Prevention  Any stairs in or around the home? Yes  If so, are there any without handrails? Yes  Home free of loose throw rugs in walkways, pet beds, electrical cords, etc? Yes  Adequate lighting in your home to reduce risk of falls? Yes  Use of a cane, walker or  w/c? No    Time Up and Go  Was the test performed? Yes .  Length of time to ambulate 10 feet: 6 sec.   Gait steady and fast without use of assistive device    Advanced Directives have been discussed with the patient? 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 Small internal hemorrhoids. otherise normal.   PARATHYROIDECTOMY     TONSILLECTOMY     UPPER GI ENDOSCOPY  08/06/2007   Previously dx Celiac Disease.Risidual pill @ 30cm without any underlying esophageal lesions. S/P biopsies and esophageal dilatation. Esophagus was dilated. Bx: Unremarkable small bowel mucosa with architecturally normal villi, Fungal forms consistent with Candida.   UPPER GI ENDOSCOPY  02/25/2017   Distal esophageal stricture s/p esophagel dilatation. SI Bx: benign small bowel mucosa. Esophagus Bx: Hyperplastic benign squamous mucosa with chronic inflammation and Eosinophila, focally in excess of 40 eosinophils per high power field, consistent with EOE.  Stomach Bx: Fundic glad polyps in a setting of chronic gastritis.      Current Medications  Current Outpatient Medications  Medication Sig Dispense Refill   diphenoxylate-atropine (LOMOTIL) 2.5-0.025 MG  tablet Take 1 tablet by mouth at bedtime. 90 tablet 2   Eluxadoline 100 MG TABS Take 1 tablet (100 mg total) by mouth daily in the afternoon. (Patient taking differently: Take 1 tablet by mouth daily as needed (Diarrhea / IBS).) 60 tablet 0   finasteride (PROSCAR) 5 MG tablet Take 5 mg by mouth daily. HALF A PILL ONCE A DAY     hydroxychloroquine (PLAQUENIL) 200 MG tablet Take 200 mg by mouth 2 (two) times daily.     hyoscyamine (LEVSIN) 0.125 MG tablet Take 1-2 tablets every 6-8 hours as needed. (Patient taking differently: Take 1-2 tablets every 6-8 hours as needed for spasms) 120 tablet 6   ketoconazole (NIZORAL) 2 % shampoo Apply 1 application. topically 3 (three) times a week.     MAGNESIUM-CALCIUM-FOLIC ACID PO Take 1 tablet by mouth daily.     Multiple Vitamins-Minerals (ZINC PO) Take 1 tablet by mouth daily.     Omega 3 340 MG CPDR Take 1 capsule by mouth daily.     OVER THE COUNTER MEDICATION Place 1 drop into both eyes daily. HydroEye drops      pantoprazole (PROTONIX) 40 MG tablet Take 1 tablet (40 mg total) by mouth daily. 30 tablet 6   tacrolimus (PROTOPIC) 0.1 % ointment Apply 1 application. topically 3 (three) times a week.     traZODone (DESYREL) 150 MG tablet Take 150 mg by mouth at bedtime.     VITAMIN D-VITAMIN K PO Take 5,000 Units by mouth daily.     No current facility-administered medications for this visit.    Allergies Dairy aid [tilactase] and Gluten meal   Social History Social History   Tobacco Use   Smoking status: Never    Passive exposure: Never   Smokeless tobacco: Never  Substance Use Topics   Alcohol use: No    Alcohol/week: 0.0 standard drinks of alcohol     Review of Systems  Review of Systems  Constitutional:  Positive for malaise/fatigue. Negative for chills, diaphoresis, fever and weight loss.  HENT:  Negative for hearing loss and tinnitus.   Eyes:  Negative for blurred vision and double vision.  Respiratory:  Negative for cough, shortness  of breath and wheezing.   Cardiovascular:  Negative for chest pain, palpitations and leg swelling.  Gastrointestinal:  Negative for abdominal pain, blood in stool, constipation, diarrhea, heartburn, melena, nausea and vomiting.  Genitourinary:  Negative for hematuria.  Musculoskeletal:  Negative for back pain and myalgias.  Skin:  Negative for itching and rash.  Neurological:  Negative for dizziness, weakness and headaches.  Psychiatric/Behavioral:  Negative for depression. The patient has insomnia. The patient is not nervous/anxious.      Physical Exam:      Body mass index is 21.8 kg/m. BP (!) 140/72   Pulse 77   Temp 97.7 F (36.5 C)   Resp 16   Ht 5' 4"  (1.626 m)   Wt 127 lb 0.4 oz (57.6 kg)   SpO2 97%   BMI 21.80 kg/m   Physical Exam Constitutional:      Appearance: Normal appearance. She is not ill-appearing.  HENT:     Head: Normocephalic and atraumatic.     Right Ear: Tympanic membrane, ear canal and external ear normal.     Left Ear: Tympanic membrane, ear canal and external ear normal.     Nose: Nose normal. No congestion or rhinorrhea.     Mouth/Throat:     Mouth: Mucous membranes are moist.     Pharynx: Oropharynx is clear. No posterior oropharyngeal erythema.  Eyes:     General: No scleral icterus.    Conjunctiva/sclera: Conjunctivae normal.     Pupils: Pupils are equal, round, and reactive to light.  Neck:     Vascular: No carotid bruit.  Cardiovascular:     Rate and Rhythm: Normal rate and regular rhythm.     Pulses: Normal pulses.     Heart sounds: Normal heart sounds. No murmur heard.    No friction rub. No gallop.  Pulmonary:     Effort: Pulmonary effort is normal. No respiratory distress.     Breath sounds: Normal breath sounds. No wheezing, rhonchi or rales.  Abdominal:     General: Abdomen is flat. Bowel sounds are normal. There is no distension.     Palpations: Abdomen is soft.     Tenderness: There is no abdominal tenderness.   Musculoskeletal:     Cervical back: Neck supple. No tenderness.     Right lower leg: No edema.     Left lower leg: No edema.  Lymphadenopathy:     Cervical: No cervical adenopathy.  Skin:    General: Skin is warm and dry.     Findings: No rash.  Neurological:     General: No focal deficit present.     Mental Status: She is alert and oriented to person, place, and time.  Psychiatric:        Mood and Affect: Mood normal.        Behavior: Behavior normal.      Assessment:      Thyroid dysfunction  Celiac disease  Eosinophilic esophagitis  Irritable bowel syndrome with diarrhea  Osteopenia of multiple sites  Gastroesophageal reflux disease without esophagitis  Hypercholesterolemia  Vitamin D deficiency  Vitamin B12 deficiency  Frontal fibrosing alopecia  History of basal cell carcinoma  BMI 21.0-21.9, adult    Plan:  During the course of the visit the patient was educated and counseled about appropriate screening and preventive services including:   Pneumococcal vaccine  Influenza vaccine Screening mammography Bone densitometry screening Colorectal cancer screening  Diet review for nutrition referral? Yes ____  Not Indicated __X__   Patient Instructions (the written plan) was given to the patient.  IBS (irritable bowel syndrome) She is no longer on fiber in her diet where she states Dr. Lyndel Safe took her off this.  I did review her GI note from earlier in the year.  I want her to continue her current meds for IBS.  GERD (gastroesophageal reflux disease) Her GERD is controlled on her PPI.  Eosinophilic esophagitis She will need a repeat EGD and colonscopy in 06/2023.  She is not on any inhalers at this time.  Celiac disease She eats appropriately for this disease state.  Thyroid dysfunction She was told she had thyroid dysfunction with hypothyroidism in the 1990's.  She is not on supplementation at this time and we will check her TFT's  today.  Osteopenia She has osteopenia.  Her exercise is appropirate.  We will repear bone density in 1-2 years.  History of basal cell carcinoma She will followup with dermatology as directed.  She was just seen and had a lesion removed this week.  Frontal fibrosing alopecia Continue her current meds for alopecia.  Vitamin D deficiency She had toxic levels of Vit D last year where we stopped her Vit D.  We will check levels again today.  Vitamin B12 deficiency We stopped her Vit B 12 supplementation this year and we will repeat her studies.  Hypercholesterolemia Continue diet and exercise for her HCL.  BMI 21.0-21.9, adult She has a healthy lifestyle and is to keep active.     Medicare Attestation I have personally reviewed: The patient's medical and social history Their use of alcohol, tobacco or illicit drugs Their current medications and supplements The patient's functional ability including ADLs,fall risks, home safety risks, cognitive, and hearing and visual impairment Diet and physical activities Evidence for depression or mood disorders  The patient's weight, height, and BMI have been recorded in the chart.  I have made referrals, counseling, and provided education to the patient based on review of the above and I have provided the patient with a written personalized care plan for preventive services.     Townsend Roger, MD   10/17/2022

## 2022-10-17 NOTE — Assessment & Plan Note (Signed)
She was told she had thyroid dysfunction with hypothyroidism in the 1990's.  She is not on supplementation at this time and we will check her TFT's today.

## 2022-10-17 NOTE — Assessment & Plan Note (Signed)
She will need a repeat EGD and colonscopy in 06/2023.  She is not on any inhalers at this time.

## 2022-10-17 NOTE — Assessment & Plan Note (Signed)
She is no longer on fiber in her diet where she states Dr. Lyndel Safe took her off this.  I did review her GI note from earlier in the year.  I want her to continue her current meds for IBS.

## 2022-10-17 NOTE — Assessment & Plan Note (Signed)
She will followup with dermatology as directed.  She was just seen and had a lesion removed this week.

## 2022-10-17 NOTE — Assessment & Plan Note (Signed)
She had toxic levels of Vit D last year where we stopped her Vit D.  We will check levels again today.

## 2022-10-18 ENCOUNTER — Ambulatory Visit: Payer: PPO

## 2022-10-18 DIAGNOSIS — E039 Hypothyroidism, unspecified: Secondary | ICD-10-CM | POA: Diagnosis not present

## 2022-10-18 DIAGNOSIS — Z6821 Body mass index (BMI) 21.0-21.9, adult: Secondary | ICD-10-CM

## 2022-10-18 DIAGNOSIS — M8580 Other specified disorders of bone density and structure, unspecified site: Secondary | ICD-10-CM

## 2022-10-18 DIAGNOSIS — E7800 Pure hypercholesterolemia, unspecified: Secondary | ICD-10-CM

## 2022-10-18 DIAGNOSIS — E538 Deficiency of other specified B group vitamins: Secondary | ICD-10-CM

## 2022-10-18 DIAGNOSIS — K219 Gastro-esophageal reflux disease without esophagitis: Secondary | ICD-10-CM | POA: Diagnosis not present

## 2022-10-18 DIAGNOSIS — F419 Anxiety disorder, unspecified: Secondary | ICD-10-CM

## 2022-10-18 DIAGNOSIS — E559 Vitamin D deficiency, unspecified: Secondary | ICD-10-CM

## 2022-10-19 ENCOUNTER — Other Ambulatory Visit: Payer: Self-pay | Admitting: Gastroenterology

## 2022-10-19 ENCOUNTER — Other Ambulatory Visit: Payer: Self-pay

## 2022-10-19 MED ORDER — TRAZODONE HCL 150 MG PO TABS: 150.0000 mg | ORAL_TABLET | Freq: Every day | ORAL | 2 refills | Status: DC

## 2022-10-25 LAB — CMP14 + ANION GAP
ALT: 11 IU/L (ref 0–32)
AST: 17 IU/L (ref 0–40)
Albumin/Globulin Ratio: 2.5 — ABNORMAL HIGH (ref 1.2–2.2)
Albumin: 4.3 g/dL (ref 3.8–4.8)
Alkaline Phosphatase: 48 IU/L (ref 44–121)
Anion Gap: 10 mmol/L (ref 10.0–18.0)
BUN/Creatinine Ratio: 22 (ref 12–28)
BUN: 17 mg/dL (ref 8–27)
Bilirubin Total: 0.4 mg/dL (ref 0.0–1.2)
CO2: 27 mmol/L (ref 20–29)
Calcium: 9.2 mg/dL (ref 8.7–10.3)
Chloride: 102 mmol/L (ref 96–106)
Creatinine, Ser: 0.77 mg/dL (ref 0.57–1.00)
Globulin, Total: 1.7 g/dL (ref 1.5–4.5)
Glucose: 95 mg/dL (ref 70–99)
Potassium: 4.5 mmol/L (ref 3.5–5.2)
Sodium: 139 mmol/L (ref 134–144)
Total Protein: 6 g/dL (ref 6.0–8.5)
eGFR: 81 mL/min/{1.73_m2} (ref >59)

## 2022-10-25 LAB — LIPID PANEL
Chol/HDL Ratio: 2.2 ratio (ref 0.0–4.4)
Cholesterol, Total: 190 mg/dL (ref 100–199)
HDL: 88 mg/dL (ref >39)
LDL Chol Calc (NIH): 93 mg/dL (ref 0–99)
Triglycerides: 43 mg/dL (ref 0–149)
VLDL Cholesterol Cal: 9 mg/dL (ref 5–40)

## 2022-10-25 LAB — CBC WITH DIFFERENTIAL/PLATELET
Basophils Absolute: 0.1 10*3/uL (ref 0.0–0.2)
Basos: 1 %
EOS (ABSOLUTE): 0.1 10*3/uL (ref 0.0–0.4)
Eos: 2 %
Hematocrit: 39.6 % (ref 34.0–46.6)
Hemoglobin: 13.1 g/dL (ref 11.1–15.9)
Immature Grans (Abs): 0 10*3/uL (ref 0.0–0.1)
Immature Granulocytes: 0 %
Lymphocytes Absolute: 1.7 10*3/uL (ref 0.7–3.1)
Lymphs: 32 %
MCH: 31.4 pg (ref 26.6–33.0)
MCHC: 33.1 g/dL (ref 31.5–35.7)
MCV: 95 fL (ref 79–97)
Monocytes Absolute: 0.4 10*3/uL (ref 0.1–0.9)
Monocytes: 7 %
Neutrophils Absolute: 2.9 10*3/uL (ref 1.4–7.0)
Neutrophils: 58 %
Platelets: 243 10*3/uL (ref 150–450)
RBC: 4.17 x10E6/uL (ref 3.77–5.28)
RDW: 12.5 % (ref 11.7–15.4)
WBC: 5.1 10*3/uL (ref 3.4–10.8)

## 2022-10-25 LAB — MAGNESIUM: Magnesium: 2 mg/dL (ref 1.6–2.3)

## 2022-10-25 LAB — VITAMIN D 1,25 DIHYDROXY
Vitamin D 1, 25 (OH)2 Total: 62 pg/mL
Vitamin D2 1, 25 (OH)2: <10 pg/mL
Vitamin D3 1, 25 (OH)2: 62 pg/mL

## 2022-10-25 LAB — TSH: TSH: 2.85 u[IU]/mL (ref 0.450–4.500)

## 2022-10-25 LAB — VITAMIN B12: Vitamin B-12: 874 pg/mL (ref 232–1245)

## 2022-11-14 ENCOUNTER — Other Ambulatory Visit: Payer: Self-pay | Admitting: Gastroenterology

## 2022-11-14 DIAGNOSIS — Z79899 Other long term (current) drug therapy: Secondary | ICD-10-CM | POA: Diagnosis not present

## 2022-11-14 DIAGNOSIS — N952 Postmenopausal atrophic vaginitis: Secondary | ICD-10-CM | POA: Diagnosis not present

## 2022-11-14 DIAGNOSIS — N3289 Other specified disorders of bladder: Secondary | ICD-10-CM | POA: Diagnosis not present

## 2022-11-14 DIAGNOSIS — R3982 Chronic bladder pain: Secondary | ICD-10-CM | POA: Diagnosis not present

## 2022-12-09 ENCOUNTER — Encounter: Payer: Self-pay | Admitting: Internal Medicine

## 2022-12-26 ENCOUNTER — Other Ambulatory Visit: Payer: Self-pay | Admitting: Internal Medicine

## 2023-01-03 DIAGNOSIS — H16223 Keratoconjunctivitis sicca, not specified as Sjogren's, bilateral: Secondary | ICD-10-CM | POA: Diagnosis not present

## 2023-01-15 DIAGNOSIS — L821 Other seborrheic keratosis: Secondary | ICD-10-CM | POA: Diagnosis not present

## 2023-01-15 DIAGNOSIS — L538 Other specified erythematous conditions: Secondary | ICD-10-CM | POA: Diagnosis not present

## 2023-01-15 DIAGNOSIS — L570 Actinic keratosis: Secondary | ICD-10-CM | POA: Diagnosis not present

## 2023-01-15 DIAGNOSIS — L298 Other pruritus: Secondary | ICD-10-CM | POA: Diagnosis not present

## 2023-01-15 DIAGNOSIS — Z85828 Personal history of other malignant neoplasm of skin: Secondary | ICD-10-CM | POA: Diagnosis not present

## 2023-01-15 DIAGNOSIS — Z08 Encounter for follow-up examination after completed treatment for malignant neoplasm: Secondary | ICD-10-CM | POA: Diagnosis not present

## 2023-01-15 DIAGNOSIS — L820 Inflamed seborrheic keratosis: Secondary | ICD-10-CM | POA: Diagnosis not present

## 2023-01-15 DIAGNOSIS — L578 Other skin changes due to chronic exposure to nonionizing radiation: Secondary | ICD-10-CM | POA: Diagnosis not present

## 2023-01-17 DIAGNOSIS — M1711 Unilateral primary osteoarthritis, right knee: Secondary | ICD-10-CM | POA: Diagnosis not present

## 2023-02-15 ENCOUNTER — Ambulatory Visit: Payer: PPO | Admitting: Internal Medicine

## 2023-02-26 ENCOUNTER — Other Ambulatory Visit: Payer: Self-pay | Admitting: Internal Medicine

## 2023-03-04 ENCOUNTER — Ambulatory Visit: Payer: PPO | Admitting: Internal Medicine

## 2023-03-04 ENCOUNTER — Encounter: Payer: Self-pay | Admitting: Internal Medicine

## 2023-03-04 VITALS — BP 130/70 | HR 84 | Temp 98.3°F | Resp 16 | Ht 64.0 in | Wt 125.0 lb

## 2023-03-04 DIAGNOSIS — E039 Hypothyroidism, unspecified: Secondary | ICD-10-CM

## 2023-03-04 DIAGNOSIS — K200 Eosinophilic esophagitis: Secondary | ICD-10-CM

## 2023-03-04 MED ORDER — TRAZODONE HCL 150 MG PO TABS: 150.0000 mg | ORAL_TABLET | Freq: Every day | ORAL | 3 refills | Status: DC

## 2023-03-04 NOTE — Assessment & Plan Note (Signed)
She seems euthryoid. We will continue to monitor her TFT's.  I will see her back in 4 months to reassess her trazodone and her cholesterol.

## 2023-03-04 NOTE — Progress Notes (Addendum)
Office Visit  Subjective   Patient ID: Kerry Lewis   DOB: 15-Jun-1951   Age: 72 y.o.   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 She had a distal esophageal stricture which was dilated.  She denies any dysphagia at this time. She was then treated with a steroid inhaler for 6 weeks. She is on a daily PPI. She was diagnosed with Celiac disease in 2001. She had an EGD done on 02/25/2017 which showed a distal esophageal stricture s/p dilatation with pathology consistent on her biopsy of EOE.  She has a history of [place] has chronic diarrhea where she takes levisin.  She was on viberzi about once a month but uses Lomotil as needed.  She has IBS that is relatively controlled and has GERD and this is controlled as above on her PPI.  She is supposed to have a repeat colonoscopy and EGD done in 06/2023.      She also has a history of hypothyroidism where she was told she had that in the early 90's.  She is currently not on any medications for this.  We have been checking her TFT's and this was normal 4 months ago.  She denies any heat/cold intolerance, unexplained weight loss gain, tremors, anxiety, insomnia, diarrhea, constipation, dry skin or other problems.      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 (Patient taking differently: Take 1 tablet by mouth daily as needed (Diarrhea / IBS).), Disp: 60 tablet, Rfl: 0   finasteride (PROSCAR) 5 MG tablet, Take 5 mg by mouth daily. HALF A PILL ONCE A DAY, Disp: , Rfl:    hydroxychloroquine (PLAQUENIL) 200 MG tablet, Take 200 mg by mouth 2 (two) times daily., Disp: , Rfl:    hyoscyamine (LEVSIN) 0.125 MG tablet, Take 1-2 tablets every 6-8 hours as needed. (Patient taking differently: Take 1-2 tablets every 6-8 hours as needed for spasms), Disp: 120 tablet, Rfl: 6   ketoconazole (NIZORAL) 2 % shampoo, Apply 1 application. topically 3 (three) times a week., Disp: , Rfl:    MAGNESIUM-CALCIUM-FOLIC ACID PO, Take 1 tablet by mouth daily., Disp: , Rfl:    Multiple Vitamins-Minerals (ZINC PO), Take 1 tablet by mouth daily., Disp: , Rfl:    Omega 3 340 MG CPDR, Take 1 capsule by mouth daily., Disp: , Rfl:    OVER  THE COUNTER MEDICATION, Place 1 drop into both eyes daily. HydroEye drops , Disp: , Rfl:    pantoprazole (PROTONIX) 40 MG tablet, TAKE ONE TABLET BY MOUTH once DAILY, Disp: 90 tablet, Rfl: 3   tacrolimus (PROTOPIC) 0.1 % ointment, Apply 1 application. topically 3 (three) times a week., Disp: , Rfl:    traZODone (DESYREL) 150 MG tablet, Take 1 tablet (150 mg total) by mouth at bedtime., Disp: 30 tablet, Rfl: 3   VITAMIN D-VITAMIN K PO, Take 5,000 Units by mouth daily., Disp: , Rfl:    Review of Systems Review of Systems  Constitutional:  Negative for chills and fever.  Respiratory:  Negative for cough and shortness of breath.   Cardiovascular:  Negative for chest pain, palpitations and leg swelling.  Gastrointestinal:  Negative for  abdominal pain, constipation, diarrhea, heartburn, nausea and vomiting.  Musculoskeletal:  Negative for myalgias.  Neurological:  Negative for dizziness, weakness and headaches.       Objective:    Vitals BP 130/70   Pulse 84   Temp 98.3 F (36.8 C)   Resp 16   Ht 5\' 4"  (1.626 m)   Wt 125 lb (56.7 kg)   SpO2 99%   BMI 21.46 kg/m    Physical Examination Physical Exam Constitutional:      Appearance: Normal appearance. She is not ill-appearing.  Cardiovascular:     Rate and Rhythm: Normal rate and regular rhythm.     Pulses: Normal pulses.     Heart sounds: No murmur heard.    No friction rub. No gallop.  Pulmonary:     Effort: Pulmonary effort is normal. No respiratory distress.     Breath sounds: No wheezing, rhonchi or rales.  Abdominal:     General: Bowel sounds are normal. There is no distension.     Palpations: Abdomen is soft.     Tenderness: There is no abdominal tenderness.  Musculoskeletal:     Right lower leg: No edema.     Left lower leg: No edema.  Skin:    General: Skin is warm and dry.     Findings: No rash.  Neurological:     General: No focal deficit present.     Mental Status: She is alert and oriented to person, place, and time.        Assessment & Plan:   Eosinophilic esophagitis She has a history of EOE, GERD and IBS.  There are all stable.  She needs a followup EGD and colonoscopy with Dr. Chales Abrahams in 06/2023.  Hypothyroidism She seems euthryoid. We will continue to monitor her TFT's.  I will see her back in 4 months to reassess her trazodone and her cholesterol.    Return in about 4 years (around 03/04/2027).   Crist Fat, MD

## 2023-03-04 NOTE — Assessment & Plan Note (Signed)
She has a history of EOE, GERD and IBS.  There are all stable.  She needs a followup EGD and colonoscopy with Dr. Chales Abrahams in 06/2023.

## 2023-03-05 ENCOUNTER — Encounter: Payer: Self-pay | Admitting: Gastroenterology

## 2023-03-05 LAB — T4, FREE: Free T4: 1.29 ng/dL (ref 0.82–1.77)

## 2023-03-05 LAB — TSH: TSH: 2.53 u[IU]/mL (ref 0.450–4.500)

## 2023-03-07 ENCOUNTER — Telehealth: Payer: Self-pay

## 2023-03-07 NOTE — Telephone Encounter (Signed)
-----   Message from Crist Fat, MD sent at 03/07/2023 10:59 AM EDT ----- Her thyroid labs are normal.

## 2023-03-07 NOTE — Telephone Encounter (Signed)
Lvm for pt about lab results

## 2023-03-22 DIAGNOSIS — Z85828 Personal history of other malignant neoplasm of skin: Secondary | ICD-10-CM | POA: Diagnosis not present

## 2023-03-22 DIAGNOSIS — L570 Actinic keratosis: Secondary | ICD-10-CM | POA: Diagnosis not present

## 2023-03-22 DIAGNOSIS — L821 Other seborrheic keratosis: Secondary | ICD-10-CM | POA: Diagnosis not present

## 2023-03-22 DIAGNOSIS — L814 Other melanin hyperpigmentation: Secondary | ICD-10-CM | POA: Diagnosis not present

## 2023-03-22 DIAGNOSIS — Z09 Encounter for follow-up examination after completed treatment for conditions other than malignant neoplasm: Secondary | ICD-10-CM | POA: Diagnosis not present

## 2023-03-22 DIAGNOSIS — Z08 Encounter for follow-up examination after completed treatment for malignant neoplasm: Secondary | ICD-10-CM | POA: Diagnosis not present

## 2023-03-22 DIAGNOSIS — D225 Melanocytic nevi of trunk: Secondary | ICD-10-CM | POA: Diagnosis not present

## 2023-03-23 ENCOUNTER — Other Ambulatory Visit: Payer: Self-pay | Admitting: Internal Medicine

## 2023-04-08 DIAGNOSIS — L219 Seborrheic dermatitis, unspecified: Secondary | ICD-10-CM | POA: Diagnosis not present

## 2023-04-08 DIAGNOSIS — Z5181 Encounter for therapeutic drug level monitoring: Secondary | ICD-10-CM | POA: Diagnosis not present

## 2023-04-08 DIAGNOSIS — L661 Lichen planopilaris: Secondary | ICD-10-CM | POA: Diagnosis not present

## 2023-04-30 DIAGNOSIS — N952 Postmenopausal atrophic vaginitis: Secondary | ICD-10-CM | POA: Diagnosis not present

## 2023-04-30 DIAGNOSIS — R3982 Chronic bladder pain: Secondary | ICD-10-CM | POA: Diagnosis not present

## 2023-06-06 ENCOUNTER — Encounter: Payer: Self-pay | Admitting: Gastroenterology

## 2023-06-06 ENCOUNTER — Ambulatory Visit (AMBULATORY_SURGERY_CENTER): Payer: PPO

## 2023-06-06 VITALS — Ht 64.0 in | Wt 125.0 lb

## 2023-06-06 DIAGNOSIS — Z1211 Encounter for screening for malignant neoplasm of colon: Secondary | ICD-10-CM

## 2023-06-06 MED ORDER — NA SULFATE-K SULFATE-MG SULF 17.5-3.13-1.6 GM/177ML PO SOLN
1.0000 | Freq: Once | ORAL | 0 refills | Status: AC
Start: 2023-06-06 — End: 2023-06-06

## 2023-06-06 NOTE — Progress Notes (Signed)

## 2023-06-12 ENCOUNTER — Other Ambulatory Visit: Payer: Self-pay | Admitting: Obstetrics & Gynecology

## 2023-06-12 DIAGNOSIS — Z1231 Encounter for screening mammogram for malignant neoplasm of breast: Secondary | ICD-10-CM

## 2023-06-27 ENCOUNTER — Telehealth: Payer: Self-pay | Admitting: Gastroenterology

## 2023-06-27 NOTE — Telephone Encounter (Signed)
Returned pts call.  Advised her that she is ok to start prep earlier than instructed.

## 2023-06-27 NOTE — Telephone Encounter (Signed)
Inbound call from patient requesting a call back regarding prep for tomorrow's procedure. Patient is concerned about the times she is going to start her prep and is wanting to know if it is possible to start taking prep earlier. Please advise, thank you.

## 2023-06-28 ENCOUNTER — Ambulatory Visit (AMBULATORY_SURGERY_CENTER): Payer: PPO | Admitting: Gastroenterology

## 2023-06-28 ENCOUNTER — Encounter: Payer: Self-pay | Admitting: Gastroenterology

## 2023-06-28 VITALS — BP 126/61 | HR 57 | Temp 96.4°F | Resp 10 | Ht 64.0 in | Wt 125.0 lb

## 2023-06-28 DIAGNOSIS — Z1211 Encounter for screening for malignant neoplasm of colon: Secondary | ICD-10-CM | POA: Diagnosis not present

## 2023-06-28 MED ORDER — SODIUM CHLORIDE 0.9 % IV SOLN: 500.0000 mL | Freq: Once | INTRAVENOUS | Status: DC

## 2023-06-28 NOTE — Patient Instructions (Signed)
YOU HAD AN ENDOSCOPIC PROCEDURE TODAY AT THE [facility]:   Refer to the procedure report that was given to you for any specific questions about what was found during the examination.  If the procedure report does not answer your questions, please call your gastroenterologist to clarify.  If you requested that your care partner not be given the details of your procedure findings, then the procedure report has been included in a sealed envelope for you to review at your convenience later.  **Handouts given on Hemorrhoids and Diverticulosis**  YOU SHOULD EXPECT: Some feelings of bloating in the abdomen. Passage of more gas than usual.  Walking can help get rid of the air that was put into your GI tract during the procedure and reduce the bloating. If you had a lower endoscopy (such as a colonoscopy or flexible sigmoidoscopy) you may notice spotting of blood in your stool or on the toilet paper. If you underwent a bowel prep for your procedure, you may not have a normal bowel movement for a few days.  Please Note:  You might notice some irritation and congestion in your nose or some drainage.  This is from the oxygen used during your procedure.  There is no need for concern and it should clear up in a day or so.  SYMPTOMS TO REPORT IMMEDIATELY:  Following lower endoscopy (colonoscopy or flexible sigmoidoscopy):  Excessive amounts of blood in the stool  Significant tenderness or worsening of abdominal pains  Swelling of the abdomen that is new, acute  Fever of 100F or higher  For urgent or emergent issues, a gastroenterologist can be reached at any hour by calling [phone]. Do not use MyChart messaging for urgent concerns.    DIET:  We do recommend a small meal at first, but then you may proceed to your regular diet.  Drink plenty of fluids but you should avoid alcoholic beverages for 24 hours.  ACTIVITY:  You should plan to take it easy for the rest of today and you should NOT  DRIVE or use heavy machinery until tomorrow (because of the sedation medicines used during the test).    FOLLOW UP: Our staff will call the number listed on your records the next business day following your procedure.  We will call around 7:15- 8:00 am to check on you and address any questions or concerns that you may have regarding the information given to you following your procedure. If we do not reach you, we will leave a message.     If any biopsies were taken you will be contacted by phone or by letter within the next 1-3 weeks.  Please call us at [phone] if you have not heard about the biopsies in 3 weeks.    SIGNATURES/CONFIDENTIALITY: You and/or your care partner have signed paperwork which will be entered into your electronic medical record.  These signatures attest to the fact that that the information above on your After Visit Summary has been reviewed and is understood.  Full responsibility of the confidentiality of this discharge information lies with you and/or your care-partner.

## 2023-06-28 NOTE — Progress Notes (Signed)
VS by CW  Pt's states no medical or surgical changes since previsit or office visit.  

## 2023-06-28 NOTE — Op Note (Signed)
[facility] Patient Name: Kerry Lewis Procedure Date: 06/28/2023 9:41 AM MRN: [id number] Endoscopist: Lynann Bologna , MD, [id number] Age: 72 Referring MD:  Date of Birth: [birth year] Gender: Female Account #: [id number] Procedure:                Colonoscopy Indications:              Screening for colorectal malignant neoplasm Medicines:                Monitored Anesthesia Care Procedure:                Pre-Anesthesia Assessment:                           - Prior to the procedure, a History and Physical                            was performed, and patient medications and                            allergies were reviewed. The patient's tolerance of                            previous anesthesia was also reviewed. The risks                            and benefits of the procedure and the sedation                            options and risks were discussed with the patient.                            All questions were answered, and informed consent                            was obtained. Prior Anticoagulants: The patient has                            taken no anticoagulant or antiplatelet agents. ASA                            Grade Assessment: II - A patient with mild systemic                            disease. After reviewing the risks and benefits,                            the patient was deemed in satisfactory condition to                            undergo the procedure.                           After obtaining informed consent, the colonoscope  was passed under direct vision. Throughout the                            procedure, the patient's blood pressure, pulse, and                            oxygen saturations were monitored continuously. The                            Olympus PCF-[id number] (#[id number]) Colonoscope was                            introduced through the anus and advanced to the the                            cecum, identified  by appendiceal orifice and                            ileocecal valve. The colonoscopy was performed                            without difficulty. The patient tolerated the                            procedure well. The quality of the bowel                            preparation was good. The ileocecal valve,                            appendiceal orifice, and rectum were photographed. Scope In: 9:53:07 AM Scope Out: 10:04:07 AM Scope Withdrawal Time: 0 hours 6 minutes 41 seconds  Total Procedure Duration: 0 hours 11 minutes 0 seconds  Findings:                 Multiple medium-mouthed diverticula were found in                            the sigmoid colon, descending colon and ascending                            colon.                           Non-bleeding internal hemorrhoids were found during                            retroflexion. The hemorrhoids were small and Grade                            I (internal hemorrhoids that do not prolapse).                           The exam was otherwise without abnormality on  direct and retroflexion views. Complications:            No immediate complications. Estimated Blood Loss:     Estimated blood loss: none. Impression:               - Mild pancolonic diverticulosis.                           - Non-bleeding internal hemorrhoids.                           - The examination was otherwise normal on direct                            and retroflexion views.                           - No specimens collected. Recommendation:           - Patient has a contact number available for                            emergencies. The signs and symptoms of potential                            delayed complications were discussed with the                            patient. Return to normal activities tomorrow.                            Written discharge instructions were provided to the                            patient.                            - Resume previous diet.                           - Continue present medications.                           - Repeat colonoscopy is not recommended for                            screening purposes. Lynann Bologna, MD 06/28/2023 10:11:47 AM This report has been signed electronically.

## 2023-07-01 ENCOUNTER — Telehealth: Payer: Self-pay

## 2023-07-01 NOTE — Telephone Encounter (Signed)
  Follow up Call-     06/28/2023    8:52 AM  Call back number  Post procedure Call Back phone  # [phone]  Permission to leave phone message Yes     Patient questions:  Do you have a fever, pain , or abdominal swelling? No. Pain Score  0 *  Have you tolerated food without any problems? Yes.    Have you been able to return to your normal activities? Yes.    Do you have any questions about your discharge instructions: Diet   No. Medications  No. Follow up visit  No.  Do you have questions or concerns about your Care? No.  Actions: * If pain score is 4 or above: No action needed, pain <4.

## 2023-07-03 ENCOUNTER — Ambulatory Visit: Payer: PPO | Admitting: Internal Medicine

## 2023-07-17 ENCOUNTER — Ambulatory Visit: Payer: PPO

## 2023-08-01 DIAGNOSIS — Z0000 Encounter for general adult medical examination without abnormal findings: Secondary | ICD-10-CM | POA: Diagnosis not present

## 2023-08-21 ENCOUNTER — Ambulatory Visit
Admission: RE | Admit: 2023-08-21 | Discharge: 2023-08-21 | Disposition: A | Discharge status: Home / Self Care | Payer: PPO | Source: Ambulatory Visit | Attending: Internal Medicine | Admitting: Internal Medicine

## 2023-08-21 DIAGNOSIS — Z1231 Encounter for screening mammogram for malignant neoplasm of breast: Secondary | ICD-10-CM

## 2023-08-24 ENCOUNTER — Other Ambulatory Visit: Payer: Self-pay | Admitting: Internal Medicine

## 2023-09-05 ENCOUNTER — Other Ambulatory Visit: Payer: Self-pay | Admitting: Internal Medicine

## 2023-09-05 ENCOUNTER — Other Ambulatory Visit: Payer: Self-pay | Admitting: Gastroenterology

## 2023-09-16 ENCOUNTER — Ambulatory Visit (INDEPENDENT_AMBULATORY_CARE_PROVIDER_SITE_OTHER): Payer: PPO | Admitting: Obstetrics and Gynecology

## 2023-09-16 ENCOUNTER — Encounter: Payer: Self-pay | Admitting: Obstetrics and Gynecology

## 2023-09-16 VITALS — BP 110/64 | HR 70 | Ht 63.78 in | Wt 126.0 lb

## 2023-09-16 DIAGNOSIS — M8589 Other specified disorders of bone density and structure, multiple sites: Secondary | ICD-10-CM

## 2023-09-16 DIAGNOSIS — Z01419 Encounter for gynecological examination (general) (routine) without abnormal findings: Secondary | ICD-10-CM | POA: Insufficient documentation

## 2023-09-16 DIAGNOSIS — N952 Postmenopausal atrophic vaginitis: Secondary | ICD-10-CM

## 2023-09-16 NOTE — Assessment & Plan Note (Addendum)
Considering applying coconut oil to the vulva after showers.

## 2023-09-16 NOTE — Progress Notes (Signed)
72 y.o. [id number] postmenopausal female here for annual exam. Married- husband dx with dementia 4 years ago.  Pt states a lot of vaginal dryness with exams. No symptoms at home. Stopped PV estrogen year ago due to concerns regarding side effects.  Postmenopausal bleeding: none Pelvic discharge or pain: none Breast mass, nipple discharge or skin changes : none Last PAP: 2019 NIL Last mammogram: 08/21/2023-neg birads 1, density c Last colonoscopy: 06/28/2023-no more screenings needed Last DXA: 06/09/2021-T-score -2.3 (60yr fracture risks= 21.1% of a major osteoporotic fracture & 8.7% of a hip fracture) Sexually active: no  Exercising: 6 days/week for one hour.   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 Small internal hemorrhoids. otherise normal.   PARATHYROIDECTOMY     TONSILLECTOMY     UPPER GI ENDOSCOPY  08/06/2007   Previously dx Celiac Disease.Risidual pill @ 30cm without any underlying esophageal lesions. S/P biopsies and esophageal dilatation. Esophagus was dilated. Bx:  Unremarkable small bowel mucosa with architecturally normal villi, Fungal forms consistent with Candida.   UPPER GI ENDOSCOPY  02/25/2017   Distal esophageal stricture s/p esophagel dilatation. SI Bx: benign small bowel mucosa. Esophagus Bx: Hyperplastic benign squamous mucosa with chronic inflammation and Eosinophila, focally in excess of 40 eosinophils per high power field, consistent with EOE.  Stomach Bx: Fundic glad polyps in a setting of chronic gastritis.    Current Outpatient Medications on File Prior to Visit  Medication Sig Dispense Refill   chlorhexidine (PERIDEX) 0.12 % solution 15 mLs 2 (two) times daily.     HYDROcodone-acetaminophen (NORCO/VICODIN) 5-325 MG tablet Take 1 tablet by mouth every 6 (six) hours as needed.     LORazepam (ATIVAN) 1 MG tablet SMARTSIG:1 Tablet(s) By Mouth     Eluxadoline 100 MG TABS Take 1 tablet (100 mg total) by mouth daily in the afternoon. (Patient taking differently: Take 1 tablet by mouth daily as needed (Diarrhea / IBS).) 60 tablet 0   finasteride (PROSCAR) 5 MG tablet Take 5 mg by mouth daily. HALF A PILL ONCE A DAY     hydroxychloroquine (PLAQUENIL) 200 MG tablet Take 200 mg by mouth 2 (two) times daily.     hyoscyamine (LEVSIN SL) 0.125 MG SL tablet TAKE ONE TABLET  BY MOUTH AS NEEDED 90 tablet 0   ketoconazole (NIZORAL) 2 % shampoo Apply 1 application. topically 3 (three) times a week.     MAGNESIUM-CALCIUM-FOLIC ACID PO Take 1 tablet by mouth daily.     minoxidil (LONITEN) 2.5 MG tablet Take by mouth.     Multiple Vitamins-Minerals (ZINC PO) Take 1 tablet by mouth daily.     Omega 3 340 MG CPDR Take 1 capsule by mouth daily.     OVER THE COUNTER MEDICATION Place 1 drop into both eyes daily. HydroEye drops      pantoprazole (PROTONIX) 40 MG tablet TAKE ONE TABLET BY MOUTH once DAILY 90 tablet 3   tacrolimus (PROTOPIC) 0.1 % ointment Apply 1 application. topically 3 (three) times a week.     traZODone (DESYREL) 150 MG tablet Take 1 tablet (150  mg total) by mouth at bedtime. 30 tablet 3   VITAMIN D-VITAMIN K PO Take 5,000 Units by mouth daily.     No current facility-administered medications on file prior to visit.    Social History   Socioeconomic History   Marital status: Married    Spouse name: Not on file   Number of children: Not on file   Years of education: Not on file   Highest education level: Not on file  Occupational History   Not on file  Tobacco Use   Smoking status: Never    Passive exposure: Never   Smokeless tobacco: Never  Vaping Use   Vaping status: Never Used  Substance and Sexual Activity   Alcohol use: No    Alcohol/week: 0.0 standard drinks of alcohol   Drug use: No   Sexual activity: Not Currently    Partners: Male    Birth control/protection: Post-menopausal    Comment: 1st intercourse- 4, partners- 1  Other Topics Concern   Not on file  Social History Narrative   Not on file   Social Determinants of Health   Financial Resource Strain: Not on file  Food Insecurity: Not on file  Transportation Needs: Not on file  Physical Activity: Not on file  Stress: Not on file  Social Connections: Not on file  Intimate Partner Violence: Not on file    Family History  Problem Relation Age of Onset   Esophageal cancer Father        smoker   Heart disease Sister    Colon cancer Maternal Uncle    Breast cancer Paternal Aunt    Diabetes Paternal Aunt    Stomach cancer Neg Hx    Pancreatic cancer Neg Hx    Colon polyps Neg Hx    Rectal cancer Neg Hx     Allergies  Allergen Reactions   Dairy Aid [Tilactase]    Gluten Meal Nausea Only    Celiac disease   Milk-Related Compounds     Other Reaction(s): EoE      PE Today's Vitals   09/16/23 1122  BP: 110/64  Pulse: 70  SpO2: 100%  Weight: 126 lb (57.2 kg)  Height: 5' 3.78" (1.62 m)   Body mass index is 21.78 kg/m.  Physical Exam Vitals reviewed. Exam conducted with a chaperone present.  Constitutional:      General: She is  not in acute distress.    Appearance: Normal appearance.  HENT:     Head: Normocephalic and atraumatic.     Nose: Nose normal.  Eyes:     Extraocular Movements: Extraocular movements intact.     Conjunctiva/sclera: Conjunctivae normal.  Neck:     Thyroid: No thyroid mass, thyromegaly or thyroid tenderness.  Pulmonary:     Effort: Pulmonary effort is normal.  Chest:     Chest wall: No mass or tenderness.  Breasts:    Right: Normal. No swelling, mass, nipple discharge, skin change or tenderness.     Left: Normal. No swelling, mass, nipple discharge, skin change or tenderness.  Abdominal:     General: There is no distension.     Palpations: Abdomen is soft.     Tenderness: There is no abdominal tenderness.  Genitourinary:    General: Normal vulva.     Exam position: Lithotomy position.     Urethra: No prolapse.     Vagina: Normal. No vaginal discharge or bleeding.     Cervix: Normal. No lesion.     Uterus: Normal. Not enlarged and not tender.      Adnexa: Right adnexa normal and left adnexa normal.  Musculoskeletal:        General: Normal range of motion.     Cervical back: Normal range of motion.  Lymphadenopathy:     Upper Body:     Right upper body: No axillary adenopathy.     Left upper body: No axillary adenopathy.     Lower Body: No right inguinal adenopathy. No left inguinal adenopathy.  Skin:    General: Skin is warm and dry.  Neurological:     General: No focal deficit present.     Mental Status: She is alert.  Psychiatric:        Mood and Affect: Mood normal.        Behavior: Behavior normal.       Assessment and Plan:        Vaginal atrophy Assessment & Plan: Considering applying coconut oil to the vulva after showers.   Well woman exam with routine gynecological exam Assessment & Plan: Cervical cancer screening discontinued in 2022 with Dr. Seymour Bars. Patient in agreement. Encouraged annual mammogram screening Colonoscopy UTD DXA due Labs and  immunizations with her primary Encouraged safe sexual practices as indicated Encouraged healthy lifestyle practices with diet and exercise For patients over 70yo, I recommend 1200mg  calcium daily and 800IU of vitamin D daily.    Osteopenia of multiple sites Assessment & Plan: Charlies Constable for 6 years, then went on Reclast for 3 years Continue vitamin D+Calcium and weight based exercise DXA due, ordered   Orders: -     DG Bone Density; Future    Rosalyn Gess, MD

## 2023-09-16 NOTE — Patient Instructions (Signed)
For patients under 50-74yo, I recommend 1200mg  calcium daily and 600IU of vitamin D daily. For patients over 74yo, I recommend 1200mg  calcium daily and 800IU of vitamin D daily.  Health Maintenance, Female Adopting a healthy lifestyle and getting preventive care are important in promoting health and wellness. Ask your health care provider about: The right schedule for you to have regular tests and exams. Things you can do on your own to prevent diseases and keep yourself healthy. What should I know about diet, weight, and exercise? Eat a healthy diet  Eat a diet that includes plenty of vegetables, fruits, low-fat dairy products, and lean protein. Do not eat a lot of foods that are high in solid fats, added sugars, or sodium. Maintain a healthy weight Body mass index (BMI) is used to identify weight problems. It estimates body fat based on height and weight. Your health care provider can help determine your BMI and help you achieve or maintain a healthy weight. Get regular exercise Get regular exercise. This is one of the most important things you can do for your health. Most adults should: Exercise for at least 150 minutes each week. The exercise should increase your heart rate and make you sweat (moderate-intensity exercise). Do strengthening exercises at least twice a week. This is in addition to the moderate-intensity exercise. Spend less time sitting. Even light physical activity can be beneficial. Watch cholesterol and blood lipids Have your blood tested for lipids and cholesterol at 74 years of age, then have this test every 5 years. Have your cholesterol levels checked more often if: Your lipid or cholesterol levels are high. You are older than 73 years of age. You are at high risk for heart disease. What should I know about cancer screening? Depending on your health history and family history, you may need to have cancer screening at various ages. This may include screening  for: Breast cancer. Cervical cancer. Colorectal cancer. Skin cancer. Lung cancer. What should I know about heart disease, diabetes, and high blood pressure? Blood pressure and heart disease High blood pressure causes heart disease and increases the risk of stroke. This is more likely to develop in people who have high blood pressure readings or are overweight. Have your blood pressure checked: Every 3-5 years if you are 83-74 years of age. Every year if you are 4 years old or older. Diabetes Have regular diabetes screenings. This checks your fasting blood sugar level. Have the screening done: Once every three years after age 68 if you are at a normal weight and have a low risk for diabetes. More often and at a younger age if you are overweight or have a high risk for diabetes. What should I know about preventing infection? Hepatitis B If you have a higher risk for hepatitis B, you should be screened for this virus. Talk with your health care provider to find out if you are at risk for hepatitis B infection. Hepatitis C Testing is recommended for: Everyone born from [birth year] through 1967. Anyone with known risk factors for hepatitis C. Sexually transmitted infections (STIs) Get screened for STIs, including gonorrhea and chlamydia, if: You are sexually active and are younger than 74 years of age. You are older than 74 years of age and your health care provider tells you that you are at risk for this type of infection. Your sexual activity has changed since you were last screened, and you are at increased risk for chlamydia or gonorrhea. Ask your health care provider if  you are at risk. Ask your health care provider about whether you are at high risk for HIV. Your health care provider may recommend a prescription medicine to help prevent HIV infection. If you choose to take medicine to prevent HIV, you should first get tested for HIV. You should then be tested every 3 months for as long as you  are taking the medicine. Osteoporosis and menopause Osteoporosis is a disease in which the bones lose minerals and strength with aging. This can result in bone fractures. If you are 44 years old or older, or if you are at risk for osteoporosis and fractures, ask your health care provider if you should: Be screened for bone loss. Take a calcium or vitamin D supplement to lower your risk of fractures. Be given hormone replacement therapy (HRT) to treat symptoms of menopause. Follow these instructions at home: Alcohol use Do not drink alcohol if: Your health care provider tells you not to drink. You are pregnant, may be pregnant, or are planning to become pregnant. If you drink alcohol: Limit how much you have to: 0-1 drink a day. Know how much alcohol is in your drink. In the [place] one drink equals one 12 oz bottle of beer (355 mL), one 5 oz glass of wine (148 mL), or one 1 oz glass of hard liquor (44 mL). Lifestyle Do not use any products that contain nicotine or tobacco. These products include cigarettes, chewing tobacco, and vaping devices, such as e-cigarettes. If you need help quitting, ask your health care provider. Do not use street drugs. Do not share needles. Ask your health care provider for help if you need support or information about quitting drugs. General instructions Schedule regular health, dental, and eye exams. Stay current with your vaccines. Tell your health care provider if: You often feel depressed. You have ever been abused or do not feel safe at home. Summary Adopting a healthy lifestyle and getting preventive care are important in promoting health and wellness. Follow your health care provider's instructions about healthy diet, exercising, and getting tested or screened for diseases. Follow your health care provider's instructions on monitoring your cholesterol and blood pressure. This information is not intended to replace advice given to you by your health  care provider. Make sure you discuss any questions you have with your health care provider. Document Revised: 03/13/2021 Document Reviewed: 03/13/2021 Elsevier Patient Education  2024 [employer].

## 2023-09-16 NOTE — Assessment & Plan Note (Signed)
Took Boniva for 6 years, then went on Reclast for 3 years Continue vitamin D+Calcium and weight based exercise DXA due, ordered

## 2023-09-16 NOTE — Assessment & Plan Note (Addendum)
Cervical cancer screening discontinued in 2022 with Dr. Seymour Bars. Patient in agreement. Encouraged annual mammogram screening Colonoscopy UTD DXA due Labs and immunizations with her primary Encouraged safe sexual practices as indicated Encouraged healthy lifestyle practices with diet and exercise For patients over 74yo, I recommend 1200mg  calcium daily and 800IU of vitamin D daily.

## 2023-09-18 ENCOUNTER — Other Ambulatory Visit: Payer: Self-pay | Admitting: Gastroenterology

## 2023-10-01 DIAGNOSIS — N201 Calculus of ureter: Secondary | ICD-10-CM | POA: Diagnosis not present

## 2023-10-01 DIAGNOSIS — R1111 Vomiting without nausea: Secondary | ICD-10-CM | POA: Diagnosis not present

## 2023-10-01 DIAGNOSIS — R1084 Generalized abdominal pain: Secondary | ICD-10-CM | POA: Diagnosis not present

## 2023-10-01 DIAGNOSIS — R9431 Abnormal electrocardiogram [ECG] [EKG]: Secondary | ICD-10-CM | POA: Diagnosis not present

## 2023-10-01 DIAGNOSIS — R42 Dizziness and giddiness: Secondary | ICD-10-CM | POA: Diagnosis not present

## 2023-10-01 DIAGNOSIS — R110 Nausea: Secondary | ICD-10-CM | POA: Diagnosis not present

## 2023-10-01 DIAGNOSIS — N1330 Unspecified hydronephrosis: Secondary | ICD-10-CM | POA: Diagnosis not present

## 2023-10-01 DIAGNOSIS — R197 Diarrhea, unspecified: Secondary | ICD-10-CM | POA: Diagnosis not present

## 2023-10-01 DIAGNOSIS — N132 Hydronephrosis with renal and ureteral calculous obstruction: Secondary | ICD-10-CM | POA: Diagnosis not present

## 2023-10-04 DIAGNOSIS — N201 Calculus of ureter: Secondary | ICD-10-CM | POA: Diagnosis not present

## 2023-10-04 DIAGNOSIS — R1084 Generalized abdominal pain: Secondary | ICD-10-CM | POA: Diagnosis not present

## 2023-10-04 DIAGNOSIS — N200 Calculus of kidney: Secondary | ICD-10-CM | POA: Diagnosis not present

## 2023-10-05 DIAGNOSIS — Z7951 Long term (current) use of inhaled steroids: Secondary | ICD-10-CM | POA: Diagnosis not present

## 2023-10-05 DIAGNOSIS — K5730 Diverticulosis of large intestine without perforation or abscess without bleeding: Secondary | ICD-10-CM | POA: Diagnosis not present

## 2023-10-05 DIAGNOSIS — K900 Celiac disease: Secondary | ICD-10-CM | POA: Diagnosis not present

## 2023-10-05 DIAGNOSIS — N201 Calculus of ureter: Secondary | ICD-10-CM | POA: Diagnosis not present

## 2023-10-05 DIAGNOSIS — I700 Atherosclerosis of aorta: Secondary | ICD-10-CM | POA: Diagnosis not present

## 2023-10-05 DIAGNOSIS — Z85828 Personal history of other malignant neoplasm of skin: Secondary | ICD-10-CM | POA: Diagnosis not present

## 2023-10-05 DIAGNOSIS — M7752 Other enthesopathy of left foot: Secondary | ICD-10-CM | POA: Diagnosis not present

## 2023-10-05 DIAGNOSIS — Z79899 Other long term (current) drug therapy: Secondary | ICD-10-CM | POA: Diagnosis not present

## 2023-10-05 DIAGNOSIS — E039 Hypothyroidism, unspecified: Secondary | ICD-10-CM | POA: Diagnosis not present

## 2023-10-05 DIAGNOSIS — N132 Hydronephrosis with renal and ureteral calculous obstruction: Secondary | ICD-10-CM | POA: Diagnosis not present

## 2023-10-05 DIAGNOSIS — R109 Unspecified abdominal pain: Secondary | ICD-10-CM | POA: Diagnosis not present

## 2023-10-05 DIAGNOSIS — K9041 Non-celiac gluten sensitivity: Secondary | ICD-10-CM | POA: Diagnosis not present

## 2023-10-05 DIAGNOSIS — L6612 Frontal fibrosing alopecia: Secondary | ICD-10-CM | POA: Diagnosis not present

## 2023-10-10 DIAGNOSIS — D225 Melanocytic nevi of trunk: Secondary | ICD-10-CM | POA: Diagnosis not present

## 2023-10-10 DIAGNOSIS — L720 Epidermal cyst: Secondary | ICD-10-CM | POA: Diagnosis not present

## 2023-10-10 DIAGNOSIS — L570 Actinic keratosis: Secondary | ICD-10-CM | POA: Diagnosis not present

## 2023-10-10 DIAGNOSIS — L538 Other specified erythematous conditions: Secondary | ICD-10-CM | POA: Diagnosis not present

## 2023-10-10 DIAGNOSIS — D485 Neoplasm of uncertain behavior of skin: Secondary | ICD-10-CM | POA: Diagnosis not present

## 2023-10-10 DIAGNOSIS — L820 Inflamed seborrheic keratosis: Secondary | ICD-10-CM | POA: Diagnosis not present

## 2023-10-10 DIAGNOSIS — L814 Other melanin hyperpigmentation: Secondary | ICD-10-CM | POA: Diagnosis not present

## 2023-10-10 DIAGNOSIS — L821 Other seborrheic keratosis: Secondary | ICD-10-CM | POA: Diagnosis not present

## 2023-10-11 DIAGNOSIS — N200 Calculus of kidney: Secondary | ICD-10-CM | POA: Diagnosis not present

## 2023-10-14 DIAGNOSIS — L658 Other specified nonscarring hair loss: Secondary | ICD-10-CM | POA: Diagnosis not present

## 2023-10-14 DIAGNOSIS — L6612 Frontal fibrosing alopecia: Secondary | ICD-10-CM | POA: Diagnosis not present

## 2023-10-14 DIAGNOSIS — L661 Lichen planopilaris, unspecified: Secondary | ICD-10-CM | POA: Diagnosis not present

## 2023-10-14 DIAGNOSIS — L219 Seborrheic dermatitis, unspecified: Secondary | ICD-10-CM | POA: Diagnosis not present

## 2023-10-15 DIAGNOSIS — L219 Seborrheic dermatitis, unspecified: Secondary | ICD-10-CM | POA: Insufficient documentation

## 2023-10-22 DIAGNOSIS — N200 Calculus of kidney: Secondary | ICD-10-CM | POA: Diagnosis not present

## 2023-10-23 DIAGNOSIS — N200 Calculus of kidney: Secondary | ICD-10-CM | POA: Diagnosis not present

## 2023-11-08 DIAGNOSIS — N200 Calculus of kidney: Secondary | ICD-10-CM | POA: Diagnosis not present

## 2023-11-18 ENCOUNTER — Ambulatory Visit: Payer: PPO | Admitting: Internal Medicine

## 2023-11-18 ENCOUNTER — Encounter: Payer: Self-pay | Admitting: Internal Medicine

## 2023-11-18 VITALS — BP 118/72 | HR 75 | Temp 98.0°F | Resp 17 | Ht 64.0 in | Wt 127.6 lb

## 2023-11-18 DIAGNOSIS — K900 Celiac disease: Secondary | ICD-10-CM

## 2023-11-18 DIAGNOSIS — K200 Eosinophilic esophagitis: Secondary | ICD-10-CM | POA: Diagnosis not present

## 2023-11-18 DIAGNOSIS — E538 Deficiency of other specified B group vitamins: Secondary | ICD-10-CM

## 2023-11-18 DIAGNOSIS — Z0000 Encounter for general adult medical examination without abnormal findings: Secondary | ICD-10-CM

## 2023-11-18 DIAGNOSIS — G4700 Insomnia, unspecified: Secondary | ICD-10-CM | POA: Insufficient documentation

## 2023-11-18 DIAGNOSIS — E079 Disorder of thyroid, unspecified: Secondary | ICD-10-CM | POA: Diagnosis not present

## 2023-11-18 DIAGNOSIS — M8589 Other specified disorders of bone density and structure, multiple sites: Secondary | ICD-10-CM

## 2023-11-18 DIAGNOSIS — E559 Vitamin D deficiency, unspecified: Secondary | ICD-10-CM

## 2023-11-18 DIAGNOSIS — Z6821 Body mass index (BMI) 21.0-21.9, adult: Secondary | ICD-10-CM

## 2023-11-18 DIAGNOSIS — L6612 Frontal fibrosing alopecia: Secondary | ICD-10-CM

## 2023-11-18 DIAGNOSIS — E7800 Pure hypercholesterolemia, unspecified: Secondary | ICD-10-CM | POA: Diagnosis not present

## 2023-11-18 DIAGNOSIS — K219 Gastro-esophageal reflux disease without esophagitis: Secondary | ICD-10-CM | POA: Diagnosis not present

## 2023-11-18 DIAGNOSIS — K580 Irritable bowel syndrome with diarrhea: Secondary | ICD-10-CM

## 2023-11-18 DIAGNOSIS — Z85828 Personal history of other malignant neoplasm of skin: Secondary | ICD-10-CM

## 2023-11-18 MED ORDER — HYOSCYAMINE SULFATE 0.125 MG SL SUBL: 0.1250 mg | SUBLINGUAL_TABLET | Freq: Four times a day (QID) | SUBLINGUAL | 0 refills | Status: AC | PRN

## 2023-11-18 NOTE — Assessment & Plan Note (Signed)
I want her to continue to diet and exercise.

## 2023-11-18 NOTE — Assessment & Plan Note (Signed)
 She will continue with her current medications.

## 2023-11-18 NOTE — Assessment & Plan Note (Signed)
 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  She will follow up with Dr. Chales Abrahams as needed.

## 2023-11-18 NOTE — Assessment & Plan Note (Signed)
 Continue on protonix at this time.

## 2023-11-18 NOTE — Assessment & Plan Note (Signed)
 She did see her OB/GYN.  She will continue with weight bearing exercises and calcium and Vit D.  They have scheduled her for a bone density which they are arranging.

## 2023-11-18 NOTE — Assessment & Plan Note (Signed)
 She is not on any cholesterol meds.  We will check a FLP on  her today.

## 2023-11-18 NOTE — Assessment & Plan Note (Signed)
 She takes hyoscyamine and immodium as needed.  She states this past year her bowel problems have actually improved.

## 2023-11-18 NOTE — Assessment & Plan Note (Signed)
 We will check her Vit D levels today.

## 2023-11-18 NOTE — Assessment & Plan Note (Signed)
 She has thyroid dysfunction where we will recheck her TFT's.  She is not on any supplementation at this time.

## 2023-11-18 NOTE — Progress Notes (Signed)
 Preventive Screening-Counseling & Management     Kerry Lewis is a 73 y.o. female who presents for Medicare Annual/Subsequent preventive examination.  Her last eye exam was in 12/2022 and she states her vision is doing well.  Her last colonoscopy was on 06/28/2023 and this showed mild pancolonic diverticulosis with non- bleeding internal hemorrhoids, otherwise normal.  Her previous colonoscopy in 07/03/2019 showed mild pancolonic diverticulosis and small internal hemorrhoids but was otherwise normal.  Her last digital screening mammogram was done on 08/21/2023 and this was normal.   The patient does exercise 6 days a week with treadmill and strength training.  She has never smoked.  The patient denies any problems with urination but she did have kidney stones in 09/2023.  She has seen urology with last visit in 11/2023 she she is s/p USM with laser for left ureteral calculus on 10/05/2023.  She had cystoscopy and stent removal on 10/11/2023.  She does get yearly flu vaccines.  She had a pneumovax 23 vaccine on 11/22/2015.  She had a Prevnar 13 vaccine on 10/26/2014.  She had her shingrix vaccines on 11/16/2022 and 01/22/2023.  She has had 4 COVID-19 vaccines including 2 boosters.  She is not interested in the RSV vaccine.  There is no family history of CAD or stroke.  There is no worsening depression or  anxiety and no memory loss.  She is not on an ASA 81mg  daily.    Dr. Charlanne diagnosed her with Eosinophilic Esophagitis (EOE) about 5 years ago. She had a distal esophageal stricture which was dilated.  She denies any GERD or dysphagia at this time. She was then treated with a steroid inhaler for 6 weeks. She is on a daily proxtonix. She was diagnosed with Celiac disease in 2001. She had an EGD done on 02/25/2017 which showed a distal esophageal stricture s/p dilatation with pathology consistent on her biopsy of EOE.  She has a history of Kerry Lewis has chronic diarrhea where she takes levisin.  She was on  viberzi  about once a month but uses Lomotil  as needed.  She has IBS that is relatively controlled and has GERD and this is controlled as above on her PPI.  She is supposed to have a repeat colonoscopy and EGD done in 06/2023.  They ddi her colonoscopy but not her EGD.  Her PMH includes Celiac disease, IBS (both C and D), Eosinophilic Esophagitis. She was diagnosed with celiac disease in 2001 and has followed a gluten free diet. This has been effective for management.  Her EOE symptoms are triggered by eating dairy.     She also has a history of hypothyroidism where she was told she had that in the early 90's.  She is currently not on any medications for this.  We have been checking her TFT's and these have been normal over the past year.  She denies any heat/cold intolerance, unexplained weight loss gain, tremors, anxiety, insomnia, diarrhea, constipation, dry skin or other problems.  She also has insomnia and sleeps about 6hrs q HS with use of Trazodone . She has issues staying asleep.   She has been following with Endocrinology/Dermatology for her Alopecia and she is treated with Finasteride and Hydroxychloroquine and minoxidil with a number of creams and shampoo. S he was diagnosed with alopecia in 2019.  She has frontal fibrosing alopecia.  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  [name] is a 73 yo female who had chest pain in 2023.  I had sent her to cardiology but ultimately she felt to have eosinophilic esophagitis (EOE).  She did undergo a coronary CT angiogram.  She presented to us  on 02/21/2022 with tightness.  There was no associated SOB, palpitations, n/v, diaphoresis, presyncope or other problems.  This was occurring daily about 2-3 times per day.  She has a history of GERD and eosinophilic esophagitis but she denies any heartburn or other symptoms at that time.  I saw her on 01/31/2022 and  did an ekg at that time that showed NSR with ST/T wave changes.  I spoke to Dr. Monetta during that visit and arranged for her to go that coming Monday about 4-5 days later to be seen in his office.  However, during that weekend on 02/04/2022 she began having the chest tightness again and took NTG.  It did not go away and she took a 2nd dose and fell to the floor.  911 was initiated and she was brought to the ER where an EKG showed she had NSR without ST/T wave changes.  She had negative serial cardiac markers and they also did labs which showed an elevated lipase.  The patient underwent a CT scan of her head which was unremarkable.  They did a RUQ that was unremarkable and then did a CT scan of her abd/pelvis which was also unremarkable.  The patient was admitted to the hospital at that time and was discharged the next day on 02/05/2022.  Her lipase trended down.  She underwent a treadmill cardiolyte stress test on 02/05/2022 which was negative for ischemia.  Cardiology saw her and they felt her chest tightness and burning in her jaw was due possibly to her eosinophilic esophagitis.  She went to go see her GI doc over the interim on 02/13/2022.  They were not convinced it was her esophagitis and wanted to wait until she had her coronary CT angiogram done by cardiology first.  The patient underwent coronary CT angiogram with a calcium score of 0 with a normal coronary origin and no evidence of coronary artery disease.   The patient is a 73 year old Caucasian/White female , post-menopausal, who presents for followup of her osteopenia. She has no specific complaints related to bone loss. She denies a family history of osteoporosis. There are no risk factors. She denies the following: smoking, diabetes mellitus, high caffeine intake, alcohol consumption of more that 7 ounces per week, daily prednisone use, hyperthyroidism, surgical resection of her bowel, and surgical resection of her stomach. She states she exercises  routinely. A bone mineral density study on 06/09/2021 and this showed a t-score of -2.3. She has a Vitamin D  deficiency which was elevated and we stopped her off her Vitamin D  last year. She did take Boniva for about 3 years in the past and took iv reclast x 1. She saw her OB/GYN this past year and they have ordered another bone density which they are arranging.   The patient returns today for routine followup on her cholesterol. The patient was diagnosed last year with hypercholesterolemia. Overall, she states she is doing well and is without any complaints or problems at this time. She specifically denies abdominal pain, nausea, vomiting, diarrhea, myalgias, and fatigue. She remains on dietary management as well as a regular exercise program She last ate less than 4-6 hours ago so we will have to hold on lab draws today.  The patient returns for folllowup of her history of depression and anxiety. Today, the patient denies every having depression or anxiety. She does have have a history of insomnia that has been occurring for years with difficulty falling to sleep. She is currently on trazodone  150mg  po qhs. She reports no additional symptoms She denies difficulty concentrating, difficulty performing routine daily activities, extreme feelings of guilt, feelings of isolation, feelings of worthlessness, helpless feeling, suicidal ideation, homicidal ideation, weight loss, loss of appetite, social withdrawal, out of control feelings, and panic attacks. This patient feels that she is able to care for herself. She currently lives with her husband. She has no significant prior history of mental health disorders.         Are there smokers in your home (other than you)? No  Risk Factors Current exercise habits:  as above   Dietary issues discussed: none   Depression Screen (Note: if answer to either of the following is Yes, a more complete depression screening is indicated)   Over the past two weeks,  have you felt down, depressed or hopeless? No  Over the past two weeks, have you felt little interest or pleasure in doing things? No  Have you lost interest or pleasure in daily life? No  Do you often feel hopeless? No  Do you cry easily over simple problems? No  Activities of Daily Living In your present state of health, do you have any difficulty performing the following activities?:  Driving? No Managing money?  No Feeding yourself? No Getting from bed to chair? No Climbing a flight of stairs? No Preparing food and eating?: No Bathing or showering? No Getting dressed: No Getting to the toilet? No Using the toilet:No Moving around from place to place: No In the past year have you fallen or had a near fall?:No   Are you sexually active?  No  Do you have more than one partner?  No  Hearing Difficulties: No Do you often ask people to speak up or repeat themselves? No Do you experience ringing or noises in your ears? No Do you have difficulty understanding soft or whispered voices? No   Do you feel that you have a problem with memory? No  Do you often misplace items? No  Do you feel safe at home?  Yes  Cognitive Testing  Alert? Yes  Normal Appearance?Yes  Oriented to person? Yes  Place? Yes   Time? Yes  Recall of three objects?  Yes  Can perform simple calculations? Yes  Displays appropriate judgment?Yes  Can read the correct time from a watch face?Yes  Fall Risk Prevention  Any stairs in or around the home? Yes  If so, are there any without handrails? Yes  Home free of loose throw rugs in walkways, pet beds, electrical cords, etc? No  Adequate lighting in your home to reduce risk of falls? Yes  Use of a cane, walker or w/c? No    Time Up and Go  Was the test performed? No .  N/A    Advanced Directives have been discussed with the patient? 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 Small internal hemorrhoids. otherise normal.   PARATHYROIDECTOMY     TONSILLECTOMY     UPPER GI ENDOSCOPY  08/06/2007   Previously dx Celiac Disease.Risidual pill @ 30cm without any underlying esophageal lesions. S/P biopsies and esophageal dilatation. Esophagus was dilated. Bx: Unremarkable small bowel mucosa with architecturally normal villi, Fungal forms consistent with Candida.   UPPER GI ENDOSCOPY  02/25/2017   Distal esophageal stricture s/p esophagel dilatation. SI Bx: benign small bowel mucosa. Esophagus Bx: Hyperplastic benign squamous mucosa with chronic inflammation and Eosinophila, focally in excess of 40 eosinophils per high power field, consistent with EOE.  Stomach Bx: Fundic glad polyps in a setting of chronic gastritis.      Current Medications  Current Outpatient Medications  Medication Sig Dispense Refill   chlorhexidine (PERIDEX) 0.12 % solution 15 mLs 2 (two) times daily.     Eluxadoline  100 MG TABS Take 1 tablet (100 mg total) by mouth daily in the afternoon. (Patient taking differently: Take 1 tablet by mouth daily as needed (Diarrhea / IBS).) 60 tablet 0   finasteride (PROSCAR) 5 MG tablet Take 5 mg by mouth daily. HALF A PILL ONCE A DAY      HYDROcodone-acetaminophen (NORCO/VICODIN) 5-325 MG tablet Take 1 tablet by mouth every 6 (six) hours as needed.     hydroxychloroquine (PLAQUENIL) 200 MG tablet Take 200 mg by mouth 2 (two) times daily.     hyoscyamine  (LEVSIN  SL) 0.125 MG SL tablet TAKE ONE TABLET BY MOUTH AS NEEDED 90 tablet 0   ketoconazole (NIZORAL) 2 % shampoo Apply 1 application. topically 3 (three) times a week.     MAGNESIUM-CALCIUM-FOLIC ACID  PO Take 1 tablet by mouth daily.     minoxidil (LONITEN) 2.5 MG tablet Take by mouth.     Multiple Vitamins-Minerals (ZINC  PO) Take 1 tablet by mouth daily.     Omega 3 340 MG CPDR Take 1 capsule by mouth daily.     pantoprazole  (PROTONIX ) 40 MG tablet TAKE ONE TABLET BY MOUTH once DAILY 90 tablet 3   tacrolimus (PROTOPIC) 0.1 % ointment Apply 1 application. topically 3 (three) times a week.     traZODone  (DESYREL ) 150 MG tablet Take 1 tablet (150 mg total) by mouth at bedtime. 30 tablet 3   VITAMIN D -VITAMIN K PO Take 5,000 Units by mouth daily.     No current facility-administered medications for this visit.    Allergies Dairy aid [tilactase], Gluten meal, and Milk-related compounds   Social History Social History   Tobacco Use   Smoking status: Never    Passive exposure: Never   Smokeless tobacco: Never  Substance Use Topics   Alcohol use: No    Alcohol/week: 0.0 standard drinks of alcohol     Review of Systems Review of Systems  Constitutional:  Positive for malaise/fatigue. Negative for chills and fever.  Eyes:  Negative for blurred vision and double vision.  Respiratory:  Negative for cough, hemoptysis, shortness of breath and wheezing.   Cardiovascular:  Positive for leg swelling. Negative for chest pain and palpitations.  Gastrointestinal:  Positive for diarrhea. Negative for abdominal pain, blood in stool, constipation, heartburn, melena, nausea and vomiting.  Genitourinary:  Negative for frequency and hematuria.  Musculoskeletal:  Negative for myalgias.   Skin:  Positive for rash. Negative for itching.  Neurological:  Negative for dizziness, weakness and headaches.  Endo/Heme/Allergies:  Negative for polydipsia.  Psychiatric/Behavioral:  Negative for depression. The patient has insomnia. The patient is not nervous/anxious.      Physical Exam:      Body mass index is 21.9 kg/m. BP 118/72   Pulse 75   Temp 98 F (36.7 C)   Resp 17   Ht 5' 4 (1.626 m)   Wt 127 lb 9.6 oz (57.9 kg)   SpO2 99%   BMI 21.90 kg/m   Physical Exam Constitutional:      Appearance: Normal appearance. She is not ill-appearing.  HENT:     Head: Normocephalic and atraumatic.     Right Ear: Tympanic membrane, ear canal and external ear normal.     Left Ear: Tympanic membrane, ear canal and external ear normal.     Nose: Nose normal. No congestion or rhinorrhea.     Mouth/Throat:     Mouth: Mucous membranes are moist.     Pharynx: Oropharynx is clear. No posterior oropharyngeal erythema.  Eyes:     General: No scleral icterus.    Conjunctiva/sclera: Conjunctivae normal.     Pupils: Pupils are equal, round, and reactive to light.  Neck:     Thyroid : No thyromegaly.     Vascular: No carotid bruit.  Cardiovascular:     Rate and Rhythm: Normal rate and regular rhythm.     Pulses: Normal pulses.     Heart sounds: Normal heart sounds. No murmur heard.    No friction rub. No gallop.  Pulmonary:     Effort: Pulmonary effort is normal. No respiratory distress.     Breath sounds: Normal breath sounds. No wheezing, rhonchi or rales.  Abdominal:     General: Abdomen is flat. Bowel sounds are normal. There is no distension.     Palpations: Abdomen is soft.     Tenderness: There is no abdominal tenderness.  Musculoskeletal:     Cervical back: Normal range of motion. No tenderness.     Right lower leg: No edema.     Left lower leg: No edema.     Comments: No clubbing or cyanosis  Lymphadenopathy:     Cervical: No cervical adenopathy.  Skin:    General:  Skin is warm and dry.     Findings: No rash.  Neurological:     General: No focal deficit present.     Mental Status: She is alert and oriented to person, place, and time.     Comments: CN II-XII grossly intact  Psychiatric:        Mood and Affect: Mood normal.        Behavior: Behavior normal.      Assessment:      Eosinophilic esophagitis  Celiac disease  Thyroid  dysfunction  Irritable bowel syndrome with diarrhea  Osteopenia of multiple sites  Gastroesophageal reflux disease without esophagitis  Hypercholesterolemia  Vitamin D  deficiency  Vitamin B12 deficiency  Frontal fibrosing alopecia  History of basal cell carcinoma  BMI 21.0-21.9, adult  Insomnia, unspecified type    Plan:     During the course of the visit the patient was educated and counseled  about appropriate screening and preventive services including:   Pneumococcal vaccine  Influenza vaccine Screening mammography Screening Pap smear and pelvic exam  Bone densitometry screening Colorectal cancer screening Advanced directives: discussed  Diet review for nutrition referral? Yes ____  Not Indicated _X___   Patient Instructions (the written plan) was given to the patient.  IBS (irritable bowel syndrome) She takes hyoscyamine  and immodium as needed.  She states this past year her bowel problems have actually improved.  GERD (gastroesophageal reflux disease) Continue on protonix  at this time.  Eosinophilic esophagitis She had some chest pain in 2023 which they felt was due to her EOE.  She will follow up with Dr. Charlanne as needed.  Celiac disease This is controlled as long as she continues on a gluten free diet.  Thyroid  dysfunction She has thyroid  dysfunction where we will recheck her TFT's.  She is not on any supplementation at this time.  Osteopenia She did see her OB/GYN.  She will continue with weight bearing exercises and calcium and Vit D.  They have scheduled her for a bone  density which they are arranging.  Frontal fibrosing alopecia She will continue with her current medications.  Vitamin D  deficiency We will check her Vit D levels today.  Vitamin B12 deficiency We will check her Vit B12 levels on her today.  Insomnia She remains on trazodone  at this time which helps.  Hypercholesterolemia She is not on any cholesterol meds.  We will check a FLP on  her today.  BMI 21.0-21.9, adult I want her to continue to diet and exercise.   Prevention Health maintenance was discussed.  We will obtain some yearly labs.   Medicare Attestation I have personally reviewed: The patient's medical and social history Their use of alcohol, tobacco or illicit drugs Their current medications and supplements The patient's functional ability including ADLs,fall risks, home safety risks, cognitive, and hearing and visual impairment Diet and physical activities Evidence for depression or mood disorders  The patient's weight, height, and BMI have been recorded in the chart.  I have made referrals, counseling, and provided education to the patient based on review of the above and I have provided the patient with a written personalized care plan for preventive services.     Selinda Kerry Finger, MD   11/18/2023

## 2023-11-18 NOTE — Assessment & Plan Note (Signed)
 She remains on trazodone at this time which helps.

## 2023-11-18 NOTE — Assessment & Plan Note (Signed)
 This is controlled as long as she continues on a gluten free diet.

## 2023-11-18 NOTE — Assessment & Plan Note (Signed)
 We will check her Vit B12 levels on her today.

## 2023-11-19 ENCOUNTER — Other Ambulatory Visit: Payer: Self-pay | Admitting: Internal Medicine

## 2023-11-19 ENCOUNTER — Other Ambulatory Visit: Payer: Self-pay | Admitting: Gastroenterology

## 2023-11-19 LAB — CMP14 + ANION GAP
ALT: 15 [IU]/L (ref 0–32)
AST: 20 [IU]/L (ref 0–40)
Albumin: 4.4 g/dL (ref 3.8–4.8)
Alkaline Phosphatase: 60 [IU]/L (ref 44–121)
Anion Gap: 13 mmol/L (ref 10.0–18.0)
BUN/Creatinine Ratio: 21 (ref 12–28)
BUN: 16 mg/dL (ref 8–27)
Bilirubin Total: 0.5 mg/dL (ref 0.0–1.2)
CO2: 25 mmol/L (ref 20–29)
Calcium: 9.7 mg/dL (ref 8.7–10.3)
Chloride: 100 mmol/L (ref 96–106)
Creatinine, Ser: 0.78 mg/dL (ref 0.57–1.00)
Globulin, Total: 1.9 g/dL (ref 1.5–4.5)
Glucose: 88 mg/dL (ref 70–99)
Potassium: 4.8 mmol/L (ref 3.5–5.2)
Sodium: 138 mmol/L (ref 134–144)
Total Protein: 6.3 g/dL (ref 6.0–8.5)
eGFR: 80 mL/min/{1.73_m2} (ref >59)

## 2023-11-19 LAB — CBC WITH DIFFERENTIAL/PLATELET
Basophils Absolute: 0.1 10*3/uL (ref 0.0–0.2)
Basos: 2 %
EOS (ABSOLUTE): 0.1 10*3/uL (ref 0.0–0.4)
Eos: 2 %
Hematocrit: 39.3 % (ref 34.0–46.6)
Hemoglobin: 12.9 g/dL (ref 11.1–15.9)
Immature Grans (Abs): 0 10*3/uL (ref 0.0–0.1)
Immature Granulocytes: 0 %
Lymphocytes Absolute: 1.6 10*3/uL (ref 0.7–3.1)
Lymphs: 39 %
MCH: 30.8 pg (ref 26.6–33.0)
MCHC: 32.8 g/dL (ref 31.5–35.7)
MCV: 94 fL (ref 79–97)
Monocytes Absolute: 0.3 10*3/uL (ref 0.1–0.9)
Monocytes: 8 %
Neutrophils Absolute: 1.9 10*3/uL (ref 1.4–7.0)
Neutrophils: 49 %
Platelets: 234 10*3/uL (ref 150–450)
RBC: 4.19 x10E6/uL (ref 3.77–5.28)
RDW: 12.4 % (ref 11.7–15.4)
WBC: 4 10*3/uL (ref 3.4–10.8)

## 2023-11-19 LAB — VITAMIN B12: Vitamin B-12: 1460 pg/mL — ABNORMAL HIGH (ref 232–1245)

## 2023-11-19 LAB — LIPID PANEL
Chol/HDL Ratio: 2.4 {ratio} (ref 0.0–4.4)
Cholesterol, Total: 237 mg/dL — ABNORMAL HIGH (ref 100–199)
HDL: 98 mg/dL (ref >39)
LDL Chol Calc (NIH): 127 mg/dL — ABNORMAL HIGH (ref 0–99)
Triglycerides: 69 mg/dL (ref 0–149)
VLDL Cholesterol Cal: 12 mg/dL (ref 5–40)

## 2023-11-19 LAB — MAGNESIUM: Magnesium: 2.2 mg/dL (ref 1.6–2.3)

## 2023-11-19 LAB — TSH: TSH: 3.09 u[IU]/mL (ref 0.450–4.500)

## 2023-11-19 LAB — T4, FREE: Free T4: 1.26 ng/dL (ref 0.82–1.77)

## 2023-11-19 LAB — HEMOGLOBIN A1C
Est. average glucose Bld gHb Est-mCnc: 105 mg/dL
Hgb A1c MFr Bld: 5.3 % (ref 4.8–5.6)

## 2023-11-19 LAB — VITAMIN D 25 HYDROXY (VIT D DEFICIENCY, FRACTURES): Vit D, 25-Hydroxy: 56 ng/mL (ref 30.0–100.0)

## 2023-12-01 NOTE — Progress Notes (Deleted)
Cardiology Office Note:    Date:  12/01/2023   ID:  Kerry, Lewis 12/17/1950, MRN [id number]  PCP:  Crist Fat, MD  Cardiologist:  Norman Herrlich, MD    Referring MD: Leonia Reader, Barbara Cower, MD    ASSESSMENT:    No diagnosis found. PLAN:    In order of problems listed above:  ***   Next appointment: ***   Medication Adjustments/Labs and Tests Ordered: Current medicines are reviewed at length with the patient today.  Concerns regarding medicines are outlined above.  No orders of the defined types were placed in this encounter.  No orders of the defined types were placed in this encounter.    History of Present Illness:    Kerry Lewis is a 73 y.o. female with a hx of esophageal disease with previous stricture dilation and eosinophilic esophagitis chest pain with previous normal myocardial perfusion study and a coronary calcium score of 0 and normal cardiac CTA without CAD or aortic atherosclerosis 02/26/2022 last seen 04/04/2022.  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      EKGs/Labs/Other Studies Reviewed:    The following studies were reviewed today:  Cardiac Studies  & Procedures     STRESS TESTS  EXERCISE TOLERANCE TEST (ETT) 02/05/2022     CT SCANS  CT CORONARY MORPH W/CTA COR W/SCORE 02/26/2022  Addendum 02/26/2022  6:05 PM ADDENDUM REPORT: 02/26/2022 18:02  CLINICAL DATA:  2F with chest pain  EXAM: Cardiac/Coronary CTA  TECHNIQUE: The patient was scanned on a [employer].  FINDINGS: A 100 kV prospective scan was triggered in the descending thoracic aorta at 111 HU's. Axial non-contrast 3 mm slices were carried out through the heart. The data set was analyzed on a dedicated work station and scored using the Agatson method. Gantry rotation speed was 250 msecs and collimation was .6 mm. 0.8 mg of sl NTG was given. The 3D data set was reconstructed in 5% intervals of the 35-75 % of the R-R cycle. Phases were analyzed on a dedicated work station using MPR, MIP and VRT modes. The patient received 80 cc of contrast.  Coronary Arteries:  Normal coronary origin.  Right dominance.  RCA is a large dominant artery that gives rise to PDA and PLA. There is no plaque.  Left main is a large artery that gives rise to LAD and LCX arteries.  LAD is a large vessel that has no plaque.  LCX is a non-dominant artery that gives rise to one large OM1 branch. There is no plaque.  Other findings:  Left Ventricle: Normal size  Left Atrium: Mild enlargement  Pulmonary Veins: Normal configuration  Right  Ventricle: Normal size  Right Atrium: Normal size  Cardiac valves: No calcifications  Thoracic aorta: Normal size  Pulmonary Arteries: Normal size  Systemic Veins: Normal drainage  Pericardium: Normal thickness  IMPRESSION: 1. Coronary calcium score of 0.  2. Normal coronary origin with right dominance.  3. No evidence of CAD.  CAD-RADS 0. No evidence of CAD (0%). Consider non-atherosclerotic causes of chest pain.   Electronically Signed By: Epifanio Lesches M.D. On: 02/26/2022 18:02  Narrative EXAM: OVER-READ  INTERPRETATION  CT CHEST  The following report is an over-read performed by radiologist Dr. Jeronimo Greaves of [facility] on 02/26/2022. This over-read does not include interpretation of cardiac or coronary anatomy or pathology. The coronary CTA interpretation by the cardiologist is attached.  COMPARISON:  02/04/2022 chest radiograph from [facility].  FINDINGS: Vascular: Aortic atherosclerosis. No central pulmonary embolism, on this non-dedicated study.  Mediastinum/Nodes: No imaged thoracic adenopathy.  Lungs/Pleura: No pleural fluid.  Clear imaged lungs.  Upper Abdomen: Normal imaged portions of the liver, spleen, stomach.  Musculoskeletal: No acute osseous abnormality.  IMPRESSION: No acute findings in the imaged extracardiac chest.  Aortic Atherosclerosis (ICD10-I70.0).  Electronically Signed: By: Jeronimo Greaves M.D. On: 02/26/2022 16:34              Recent Labs: 11/18/2023: ALT 15; BUN 16; Creatinine, Ser 0.78; Hemoglobin 12.9; Magnesium 2.2; Platelets 234; Potassium 4.8; Sodium 138; TSH 3.090  Recent Lipid Panel    Component Value Date/Time   CHOL 237 (H) 11/18/2023 1035   TRIG 69 11/18/2023 1035   HDL 98 11/18/2023 1035   CHOLHDL 2.4 11/18/2023 1035   LDLCALC 127 (H) 11/18/2023 1035    Physical Exam:    VS:  There were no vitals taken for this visit.    Wt Readings from Last 3 Encounters:  11/18/23 127 lb 9.6 oz (57.9 kg)  09/16/23 126 lb (57.2 kg)  06/28/23 125 lb (56.7 kg)     GEN: *** Well nourished, well developed in no acute distress HEENT: Normal NECK: No JVD; No carotid bruits LYMPHATICS: No lymphadenopathy CARDIAC: ***RRR, no murmurs, rubs, gallops RESPIRATORY:  Clear to auscultation without rales, wheezing or rhonchi  ABDOMEN: Soft, non-tender, non-distended MUSCULOSKELETAL:  No edema; No deformity  SKIN: Warm and dry NEUROLOGIC:  Alert and oriented x 3 PSYCHIATRIC:  Normal affect    Signed, Norman Herrlich, MD  12/01/2023  9:43 AM    [facility]

## 2023-12-02 ENCOUNTER — Ambulatory Visit: Payer: PPO | Admitting: Cardiology

## 2023-12-02 NOTE — Progress Notes (Signed)
Kerry Fat, MD  Christianne Dolin, CMA Her labs look good.  Pt. Informed 12/02/23 08:13 a.m.

## 2023-12-03 DIAGNOSIS — L821 Other seborrheic keratosis: Secondary | ICD-10-CM | POA: Diagnosis not present

## 2023-12-03 DIAGNOSIS — L309 Dermatitis, unspecified: Secondary | ICD-10-CM | POA: Diagnosis not present

## 2023-12-09 ENCOUNTER — Ambulatory Visit (INDEPENDENT_AMBULATORY_CARE_PROVIDER_SITE_OTHER)
Admission: RE | Admit: 2023-12-09 | Discharge: 2023-12-09 | Disposition: A | Discharge status: Home / Self Care | Payer: PPO | Source: Ambulatory Visit | Attending: Obstetrics and Gynecology | Admitting: Obstetrics and Gynecology

## 2023-12-09 DIAGNOSIS — M8589 Other specified disorders of bone density and structure, multiple sites: Secondary | ICD-10-CM | POA: Diagnosis not present

## 2023-12-09 DIAGNOSIS — Z780 Asymptomatic menopausal state: Secondary | ICD-10-CM | POA: Diagnosis not present

## 2023-12-09 DIAGNOSIS — M810 Age-related osteoporosis without current pathological fracture: Secondary | ICD-10-CM | POA: Diagnosis not present

## 2023-12-26 ENCOUNTER — Telehealth: Payer: Self-pay

## 2023-12-26 NOTE — Telephone Encounter (Signed)
Per [facility]: "See result note. Needs appt to discuss."  Pt advised and voiced understanding. Scheduled for 01/01/2024.   Encounter closed.

## 2023-12-26 NOTE — Telephone Encounter (Signed)
Pt LVM in triage line asking for some results/review recommendations at Dr. Kennith Center earliest convenience for DEXA performed on 12/09/2023.

## 2024-01-01 ENCOUNTER — Ambulatory Visit: Payer: PPO | Admitting: Obstetrics and Gynecology

## 2024-01-01 VITALS — BP 122/62 | HR 73 | Temp 98.2°F | Wt 129.0 lb

## 2024-01-01 DIAGNOSIS — M816 Localized osteoporosis [Lequesne]: Secondary | ICD-10-CM

## 2024-01-01 NOTE — Progress Notes (Signed)
 73 y.o. [id number] postmenopausal female with eosinophilic esophagitis here for osteoporosis consultation. Married- husband dx with dementia 4 years ago.  12/09/23 DXA T-score -2.6 at left femoral neck. Took Boniva for 6 years, then went on Reclast for 3 years.  Postmenopausal bleeding: none Pelvic discharge or pain: none Breast mass, nipple discharge or skin changes : none Last PAP: 2019 NIL Last mammogram: 08/21/2023-neg birads 1, density c Last colonoscopy: 06/28/2023-no more screenings needed Last DXA: 06/09/2021-T-score -2.3 (40yr fracture risks= 21.1% of a major osteoporotic fracture & 8.7% of a hip fracture) Sexually active: no  Exercising: 6 days/week for one hour.   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 Small internal hemorrhoids. otherise normal.   PARATHYROIDECTOMY     TONSILLECTOMY     UPPER GI ENDOSCOPY  08/06/2007   Previously dx Celiac Disease.Risidual pill @ 30cm without any underlying esophageal lesions. S/P biopsies and esophageal dilatation. Esophagus was dilated. Bx:  Unremarkable small bowel mucosa with architecturally normal villi, Fungal forms consistent with Candida.   UPPER GI ENDOSCOPY  02/25/2017   Distal esophageal stricture s/p esophagel dilatation. SI Bx: benign small bowel mucosa. Esophagus Bx: Hyperplastic benign squamous mucosa with chronic inflammation and Eosinophila, focally in excess of 40 eosinophils per high power field, consistent with EOE.  Stomach Bx: Fundic glad polyps in a setting of chronic gastritis.    Current Outpatient Medications on File Prior to Visit  Medication Sig Dispense Refill   betamethasone dipropionate 0.05 % lotion Apply to the vertex scalp every other day as needed.     finasteride (PROSCAR) 5 MG tablet Take 5 mg by mouth daily. HALF A PILL ONCE A DAY     hydroxychloroquine (PLAQUENIL) 200 MG tablet Take 200 mg by mouth 2 (two) times daily.     hyoscyamine (LEVSIN SL) 0.125 MG SL tablet Take 1 tablet (0.125 mg total) by mouth every 6 (six) hours as needed. 90 tablet 0   ketoconazole (NIZORAL) 2 % shampoo Apply 1 application. topically 3 (three) times a week.     MAGNESIUM-CALCIUM-FOLIC ACID PO Take 1 tablet by mouth daily.     minoxidil (LONITEN) 2.5 MG tablet Take by mouth.     Multiple Vitamins-Minerals (ZINC PO) Take 1 tablet by mouth daily.  Omega 3 340 MG CPDR Take 1 capsule by mouth daily.     pantoprazole (PROTONIX) 40 MG tablet TAKE ONE TABLET BY MOUTH once DAILY 90 tablet 3   tacrolimus (PROTOPIC) 0.1 % ointment Apply 1 application. topically 3 (three) times a week.     traZODone (DESYREL) 150 MG tablet Take 1 tablet (150 mg total) by mouth at bedtime. 30 tablet 3   VITAMIN D-VITAMIN K PO Take 3,000 Units by mouth 3 (three) times a week.     No current facility-administered medications on file prior to visit.   Allergies  Allergen Reactions   Dairy Aid [Tilactase]    Gluten Meal Nausea Only    Celiac disease   Milk-Related Compounds     Other Reaction(s): EoE      PE Today's Vitals   01/01/24  1429  BP: 122/62  Pulse: 73  Temp: 98.2 F (36.8 C)  TempSrc: Oral  SpO2: 98%  Weight: 129 lb (58.5 kg)   Body mass index is 22.14 kg/m.  Physical Exam Vitals reviewed. Exam conducted with a chaperone present.  Constitutional:      General: She is not in acute distress.    Appearance: Normal appearance.  HENT:     Head: Normocephalic and atraumatic.     Nose: Nose normal.  Eyes:     Extraocular Movements: Extraocular movements intact.     Conjunctiva/sclera: Conjunctivae normal.  Neck:     Thyroid: No thyroid mass, thyromegaly or thyroid tenderness.  Pulmonary:     Effort: Pulmonary effort is normal.  Chest:     Chest wall: No mass or tenderness.  Breasts:    Right: Normal. No swelling, mass, nipple discharge, skin change or tenderness.     Left: Normal. No swelling, mass, nipple discharge, skin change or tenderness.  Abdominal:     General: There is no distension.     Palpations: Abdomen is soft.     Tenderness: There is no abdominal tenderness.  Genitourinary:    General: Normal vulva.     Exam position: Lithotomy position.     Urethra: No prolapse.     Vagina: Normal. No vaginal discharge or bleeding.     Cervix: Normal. No lesion.     Uterus: Normal. Not enlarged and not tender.      Adnexa: Right adnexa normal and left adnexa normal.  Musculoskeletal:        General: Normal range of motion.     Cervical back: Normal range of motion.  Lymphadenopathy:     Upper Body:     Right upper body: No axillary adenopathy.     Left upper body: No axillary adenopathy.     Lower Body: No right inguinal adenopathy. No left inguinal adenopathy.  Skin:    General: Skin is warm and dry.  Neurological:     General: No focal deficit present.     Mental Status: She is alert.  Psychiatric:        Mood and Affect: Mood normal.        Behavior: Behavior normal.       Assessment and Plan:        Localized osteoporosis without current pathological fracture Assessment &  Plan: Bone density report discussed.  Risk factors assessed: Menopause Counseled regarding osteoporosis, risk factors, treatment options. Discussed importance of weight bearing exercise, daily calcium 1200 mg daily and vit D, and fall risk reduction. Normal Vit D and calcium levels Endocrinology referral isnot indicated at this time, however  patient has completed 9-10 years of bisphosphonates previously. Patient does understand that osteoporosis is a silent disease until fracture occurs and understands that there is significant morbidity associated with osteoporotic fracture.    Recommend prolia vs restarting bisphosphonates. Will start with prolia. Jake Seats 60mg  SQ injection (upper arm, thigh, or the abdomen) q6 months for up to 10y. Risk include hypocalcemia, musculoskeletal pain, rapid bone loss, osteonecrosis of the jaw, and increased fracture risk following discontinuation of medication. Requires calcium within 10d of administration. Ensure dental checks, calcium and vitamin D supplementation. DXA q2y. Transition to bisphosphonate 6 month after last dose.   Seeing dentist March 2025- will need clearance Agreeable to starting process of acquiring coverage for prolia.   Orders: -     Denosumab; Inject 60 mg into the skin once for 1 dose.  Dispense: 1 mL; Refill: 1   Rosalyn Gess, MD

## 2024-01-01 NOTE — Patient Instructions (Signed)

## 2024-01-06 ENCOUNTER — Encounter: Payer: Self-pay | Admitting: Obstetrics and Gynecology

## 2024-01-06 DIAGNOSIS — H16223 Keratoconjunctivitis sicca, not specified as Sjogren's, bilateral: Secondary | ICD-10-CM | POA: Diagnosis not present

## 2024-01-06 DIAGNOSIS — M816 Localized osteoporosis [Lequesne]: Secondary | ICD-10-CM | POA: Insufficient documentation

## 2024-01-06 MED ORDER — DENOSUMAB 60 MG/ML ~~LOC~~ SOSY
60.0000 mg | PREFILLED_SYRINGE | Freq: Once | SUBCUTANEOUS | 1 refills | Status: AC
Start: 2024-01-06 — End: 2024-01-06

## 2024-01-06 NOTE — Assessment & Plan Note (Addendum)
 Bone density report discussed.  Risk factors assessed: Menopause Counseled regarding osteoporosis, risk factors, treatment options. Discussed importance of weight bearing exercise, daily calcium 1200 mg daily and vit D, and fall risk reduction. Normal Vit D and calcium levels Endocrinology referral isnot indicated at this time, however patient has completed 9-10 years of bisphosphonates previously. Patient does understand that osteoporosis is a silent disease until fracture occurs and understands that there is significant morbidity associated with osteoporotic fracture.    Recommend prolia vs restarting bisphosphonates. Will start with prolia. Kerry Lewis 60mg  SQ injection (upper arm, thigh, or the abdomen) q6 months for up to 10y. Risk include hypocalcemia, musculoskeletal pain, rapid bone loss, osteonecrosis of the jaw, and increased fracture risk following discontinuation of medication. Requires calcium within 10d of administration. Ensure dental checks, calcium and vitamin D supplementation. DXA q2y. Transition to bisphosphonate 6 month after last dose.   Seeing dentist March 2025- will need clearance Agreeable to starting process of acquiring coverage for prolia.

## 2024-01-07 ENCOUNTER — Telehealth: Payer: Self-pay | Admitting: *Deleted

## 2024-01-07 DIAGNOSIS — L905 Scar conditions and fibrosis of skin: Secondary | ICD-10-CM | POA: Diagnosis not present

## 2024-01-07 DIAGNOSIS — Z872 Personal history of diseases of the skin and subcutaneous tissue: Secondary | ICD-10-CM | POA: Diagnosis not present

## 2024-01-07 DIAGNOSIS — L820 Inflamed seborrheic keratosis: Secondary | ICD-10-CM | POA: Diagnosis not present

## 2024-01-07 DIAGNOSIS — L2989 Other pruritus: Secondary | ICD-10-CM | POA: Diagnosis not present

## 2024-01-07 NOTE — Telephone Encounter (Signed)
Insurance information submitted to [employer] portal. Will await summary of benefits for prolia.    

## 2024-01-07 NOTE — Telephone Encounter (Signed)
-----   Message from Nurse Chapman Fitch sent at 01/07/2024  8:46 AM EST ----- Did you receive this patient?   [facility] ----- Message ----- From: Rosalyn Gess, MD Sent: 01/06/2024   9:21 PM EST To: Leda Min, RN  Sending in case patient needs PA for prolia, to start process.

## 2024-01-21 NOTE — Telephone Encounter (Signed)
 Patient returned call. Patient states she has been reading up on Prolia and Evenity and declines to proceed with scheduling Prolia. Patient states she would like to try some lifestyle changes first. Wants to increase her weight-bearing exercise regimen and increase her calcium and vitamin d intake. Patient requesting a copy of BMD be mailed to her. Message to medical records. RN advised would update Dr. Kennith Center. Patient agreeable. Aware to contact the office with any questions.   Routing to provider and will close encounter.

## 2024-01-21 NOTE — Telephone Encounter (Signed)
 Deductible:  none   OOP MAX: $3,400 ($125 met)  Annual exam: 09/16/23 [facility]  Calcium:   9.7   Date: 11/18/23  Upcoming dental procedures:   Hx of Kidney Disease:   Last Bone Density Scan: 12/09/23  Is Prior Authorization needed: no  Pt estimated Cost: $276   Coverage Details: 20% coinsurance for prolia, OOP max is $3,400 ($125) No deductible applies.

## 2024-01-21 NOTE — Telephone Encounter (Signed)
 Call to [facility], spoke with Dianna, advocate who states patient responsibility is 20% coinsurance for prolia. No deductible. OOP max is $3,400 ($125 met). No prior authorization is required.

## 2024-01-21 NOTE — Telephone Encounter (Signed)
 Call to patient. States she is at a doctors appointment, requesting to call back at her convenience.

## 2024-01-27 ENCOUNTER — Encounter: Payer: Self-pay | Admitting: Internal Medicine

## 2024-01-27 ENCOUNTER — Ambulatory Visit: Admitting: Internal Medicine

## 2024-01-27 VITALS — BP 122/72 | HR 79 | Temp 98.1°F | Resp 18 | Ht 64.5 in | Wt 129.0 lb

## 2024-01-27 DIAGNOSIS — M62838 Other muscle spasm: Secondary | ICD-10-CM | POA: Insufficient documentation

## 2024-01-27 NOTE — Progress Notes (Signed)
 Office Visit  Subjective   Patient ID: Kerry Lewis   DOB: July 11, 1951   Age: 73 y.o.   MRN: [id number]   Chief Complaint Chief Complaint  Patient presents with   office visit    Patient here for pressure in the back of her head     History of Present Illness Kerry Lewis is a 73 yo female who comes in today with complaints of pressure in the back of her head.  She states started 2 weeks ago.  She first noted that when she sleeps on left side, her left ear feels heavy.  This occurs intermittently.  However, she developed pressure in the back of her head and especially noticed it when she was playing.  This is not a headache but a heavy feeling in the back of her head with occasional radiation into her neck.  There is no numbness/tingling  or weakness in her hands and she has had some mild tingling in her toes.     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 HALF A PILL ONCE A DAY, Disp: , Rfl:    hydroxychloroquine (PLAQUENIL) 200 MG tablet, Take 200 mg by mouth 2 (two) times  daily., Disp: , Rfl:    hyoscyamine (LEVSIN SL) 0.125 MG SL tablet, Take 1 tablet (0.125 mg total) by mouth every 6 (six) hours as needed., Disp: 90 tablet, Rfl: 0   ketoconazole (NIZORAL) 2 % shampoo, Apply 1 application. topically 3 (three) times a week., Disp: , Rfl:    MAGNESIUM-CALCIUM-FOLIC ACID PO, Take 1 tablet by mouth daily., Disp: , Rfl:    minoxidil (LONITEN) 2.5 MG tablet, Take by mouth., Disp: , Rfl:    Multiple Vitamins-Minerals (ZINC PO), Take 1 tablet by mouth daily., Disp: , Rfl:    Omega 3 340 MG CPDR, Take 1 capsule by mouth daily., Disp: , Rfl:    pantoprazole (PROTONIX) 40 MG tablet, TAKE ONE TABLET BY MOUTH once DAILY, Disp: 90 tablet, Rfl: 3   tacrolimus (PROTOPIC) 0.1 % ointment, Apply 1 application. topically 3 (three) times a week., Disp: , Rfl:    traZODone (DESYREL) 150 MG tablet, Take 1 tablet (150 mg total) by mouth at bedtime., Disp: 30 tablet, Rfl: 3   VITAMIN D-VITAMIN K PO, Take 3,000 Units by mouth 3 (three) times a week., Disp: , Rfl:    Review of Systems Review of Systems  Constitutional:  Negative for chills and fever.  HENT:  Negative for ear pain and hearing loss.   Eyes:  Negative for blurred vision and double vision.  Respiratory:  Negative for shortness of breath.   Cardiovascular:  Negative for chest pain, palpitations and leg swelling.  Gastrointestinal:  Negative for constipation, diarrhea, nausea and vomiting.  Musculoskeletal:  Positive for back pain. Negative for myalgias and neck pain.       Pain in lower back  Skin:  Negative for rash.  Neurological:  Negative for dizziness, focal weakness, weakness and headaches.       Objective:    Vitals BP 122/72 (BP Location: Left Arm, Patient Position: Sitting, Cuff Size: Normal)   Pulse 79   Temp 98.1 F (36.7 C)   Resp 18   Ht 5' 4.5" (1.638 m)   Wt 129 lb (58.5 kg)   SpO2 99%   BMI 21.80 kg/m    Physical Examination Physical Exam Constitutional:      Appearance: Normal  appearance. She is not ill-appearing.  HENT:     Left Ear: Tympanic membrane, ear canal and external ear normal.  Cardiovascular:     Rate and Rhythm: Normal rate and regular rhythm.     Pulses: Normal pulses.     Heart sounds: No murmur heard.    No friction rub. No gallop.  Pulmonary:     Effort: Pulmonary effort is normal. No respiratory distress.     Breath sounds: No wheezing, rhonchi or rales.  Abdominal:     General: Bowel sounds are normal. There is no distension.     Palpations: Abdomen is soft.     Tenderness: There is no abdominal tenderness.  Musculoskeletal:     Right lower leg: No edema.     Left lower leg: No edema.     Comments: She has muscle spasm at back of her neck and to the attachments the cranial ridge.  Skin:    General: Skin is warm and dry.     Findings: No rash.  Neurological:     Mental Status: She is alert.        Assessment & Plan:   Muscle spasm She has muscle spasm of the back of her neck and to the attachment to the cranial ridge.  I have asked her to use heat on this and she can use ibuprofen/tyelnol as needed.  We will refer her to massage therapy.    No follow-ups on file.   Crist Fat, MD

## 2024-01-27 NOTE — Assessment & Plan Note (Signed)
 She has muscle spasm of the back of her neck and to the attachment to the cranial ridge.  I have asked her to use heat on this and she can use ibuprofen/tyelnol as needed.  We will refer her to massage therapy.

## 2024-02-12 DIAGNOSIS — E063 Autoimmune thyroiditis: Secondary | ICD-10-CM | POA: Diagnosis not present

## 2024-02-12 DIAGNOSIS — Z0000 Encounter for general adult medical examination without abnormal findings: Secondary | ICD-10-CM | POA: Diagnosis not present

## 2024-02-12 DIAGNOSIS — Z8249 Family history of ischemic heart disease and other diseases of the circulatory system: Secondary | ICD-10-CM | POA: Diagnosis not present

## 2024-02-12 DIAGNOSIS — Z79890 Hormone replacement therapy (postmenopausal): Secondary | ICD-10-CM | POA: Diagnosis not present

## 2024-02-13 DIAGNOSIS — M79605 Pain in left leg: Secondary | ICD-10-CM | POA: Diagnosis not present

## 2024-02-13 DIAGNOSIS — M79604 Pain in right leg: Secondary | ICD-10-CM | POA: Diagnosis not present

## 2024-02-24 ENCOUNTER — Ambulatory Visit: Admitting: Internal Medicine

## 2024-02-24 ENCOUNTER — Encounter: Payer: Self-pay | Admitting: Internal Medicine

## 2024-02-24 VITALS — BP 116/74 | HR 74 | Temp 98.1°F | Resp 16 | Ht 64.5 in | Wt 129.6 lb

## 2024-02-24 DIAGNOSIS — R0683 Snoring: Secondary | ICD-10-CM | POA: Insufficient documentation

## 2024-02-24 DIAGNOSIS — R400 Somnolence: Secondary | ICD-10-CM | POA: Diagnosis not present

## 2024-02-24 NOTE — Progress Notes (Signed)
 Office Visit  Subjective   Patient ID: Kerry Lewis   DOB: 11-Nov-1950   Age: 73 y.o.   MRN: [id number]   Chief Complaint Chief Complaint  Patient presents with   Acute Visit     History of Present Illness Kerry Lewis is a 73 yo female who comes in today to request to be tested for sleep apnea.  The patient snores, with frequent awakenings, not feeling well rested and taking naps during the next day.  She states she has problems getting to sleep.       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 HALF A PILL ONCE A DAY, Disp: , Rfl:    hydroxychloroquine (PLAQUENIL) 200 MG tablet, Take 200 mg by mouth 2 (two) times daily., Disp: , Rfl:    hyoscyamine  (LEVSIN SL) 0.125 MG SL tablet, Take 1 tablet (0.125 mg total) by mouth every 6 (six) hours as needed., Disp: 90 tablet, Rfl: 0   ketoconazole (NIZORAL) 2 % shampoo, Apply 1 application. topically 3 (three) times a week., Disp: , Rfl:    MAGNESIUM-CALCIUM-FOLIC ACID  PO, Take 1 tablet by mouth daily., Disp: , Rfl:    minoxidil  (LONITEN) 2.5 MG tablet, Take by mouth., Disp: , Rfl:    Multiple Vitamins-Minerals (ZINC PO), Take 1 tablet by mouth daily., Disp: , Rfl:    Omega 3 340 MG CPDR, Take 1 capsule by mouth daily., Disp: , Rfl:    pantoprazole  (PROTONIX ) 40 MG tablet, TAKE ONE TABLET BY MOUTH once DAILY, Disp: 90 tablet, Rfl: 3   tacrolimus (PROTOPIC) 0.1 % ointment, Apply 1 application. topically 3 (three) times a week., Disp: , Rfl:    traZODone  (DESYREL ) 150 MG tablet, Take 1 tablet (150 mg total) by mouth at bedtime., Disp: 30 tablet, Rfl: 3   VITAMIN D -VITAMIN K PO, Take 3,000 Units by mouth 3 (three) times a week., Disp: , Rfl:    Review of Systems Review of Systems  Constitutional:  Negative for chills and fever.  Respiratory:  Negative for cough, hemoptysis, shortness of breath and wheezing.   Cardiovascular:  Negative for chest pain, palpitations and leg swelling.  Gastrointestinal:  Negative for abdominal pain, constipation, diarrhea, nausea and vomiting.  Musculoskeletal:  Negative for myalgias.  Neurological:  Negative for dizziness, weakness and headaches.       Objective:    Vitals BP 116/74   Pulse 74   Temp 98.1 F (36.7 C)  Resp 16   Ht 5' 4.5" (1.638 m)   Wt 129 lb 9.6 oz (58.8 kg)   SpO2 99%   BMI 21.90 kg/m    Physical Examination Physical Exam Constitutional:      Appearance: Normal appearance. She is not ill-appearing.  Cardiovascular:     Rate and Rhythm: Normal rate and regular rhythm.     Pulses: Normal pulses.     Heart sounds: No murmur heard.    No friction rub. No gallop.  Pulmonary:     Effort: Pulmonary effort is normal. No respiratory distress.     Breath sounds: No wheezing, rhonchi or rales.  Abdominal:     General: Bowel sounds are normal. There is no distension.     Palpations: Abdomen is soft.     Tenderness: There is no abdominal tenderness.  Musculoskeletal:     Right lower leg: No edema.     Left lower leg: No edema.  Skin:    General: Skin is  warm and dry.     Findings: No rash.  Neurological:     Mental Status: She is alert.        Assessment & Plan:   Snoring We will set her up for a overnight home pulse oximetry.  Daytime somnolence Plan as above.    No follow-ups on file.   Wayne Haines, MD

## 2024-02-24 NOTE — Assessment & Plan Note (Signed)
 We will set her up for a overnight home pulse oximetry.

## 2024-02-24 NOTE — Assessment & Plan Note (Signed)
 Plan as above.

## 2024-02-26 DIAGNOSIS — M545 Low back pain, unspecified: Secondary | ICD-10-CM | POA: Diagnosis not present

## 2024-03-02 DIAGNOSIS — M545 Low back pain, unspecified: Secondary | ICD-10-CM | POA: Diagnosis not present

## 2024-03-04 DIAGNOSIS — M545 Low back pain, unspecified: Secondary | ICD-10-CM | POA: Diagnosis not present

## 2024-03-09 DIAGNOSIS — M545 Low back pain, unspecified: Secondary | ICD-10-CM | POA: Diagnosis not present

## 2024-03-11 DIAGNOSIS — M545 Low back pain, unspecified: Secondary | ICD-10-CM | POA: Diagnosis not present

## 2024-03-16 ENCOUNTER — Ambulatory Visit: Payer: PPO | Admitting: Internal Medicine

## 2024-03-16 DIAGNOSIS — M545 Low back pain, unspecified: Secondary | ICD-10-CM | POA: Diagnosis not present

## 2024-03-18 DIAGNOSIS — M545 Low back pain, unspecified: Secondary | ICD-10-CM | POA: Diagnosis not present

## 2024-03-23 DIAGNOSIS — M545 Low back pain, unspecified: Secondary | ICD-10-CM | POA: Diagnosis not present

## 2024-04-10 DIAGNOSIS — L820 Inflamed seborrheic keratosis: Secondary | ICD-10-CM | POA: Diagnosis not present

## 2024-04-10 DIAGNOSIS — L538 Other specified erythematous conditions: Secondary | ICD-10-CM | POA: Diagnosis not present

## 2024-04-10 DIAGNOSIS — L570 Actinic keratosis: Secondary | ICD-10-CM | POA: Diagnosis not present

## 2024-04-10 DIAGNOSIS — L814 Other melanin hyperpigmentation: Secondary | ICD-10-CM | POA: Diagnosis not present

## 2024-04-10 DIAGNOSIS — L821 Other seborrheic keratosis: Secondary | ICD-10-CM | POA: Diagnosis not present

## 2024-04-10 DIAGNOSIS — D225 Melanocytic nevi of trunk: Secondary | ICD-10-CM | POA: Diagnosis not present

## 2024-05-04 DIAGNOSIS — L661 Lichen planopilaris, unspecified: Secondary | ICD-10-CM | POA: Diagnosis not present

## 2024-05-04 DIAGNOSIS — L219 Seborrheic dermatitis, unspecified: Secondary | ICD-10-CM | POA: Diagnosis not present

## 2024-05-04 DIAGNOSIS — L6612 Frontal fibrosing alopecia: Secondary | ICD-10-CM | POA: Diagnosis not present

## 2024-05-04 NOTE — Progress Notes (Signed)
 " Cardiology Office Note   Date:  05/05/2024  ID:  Kerry, Lewis 03/17/1951, MRN [id number] PCP: Fleeta Valeria Mayo, MD  [facility] Providers Cardiologist:  Redell Leiter, MD     History of Present Illness Kerry Lewis is a 73 y.o. female with a past medical history of eosinophilic esophagitis, celiac disease, GERD, hypothyroidism, IBS.  02/25/2022 coronary CTA calcium score of 0 02/05/2022 ETT negative for inducible ischemia 02/04/2022 echoEF 60 to 65%, impaired relaxation, mild MR, mild TR  She established care with Dr. Leiter in 2023 at the behest of her PCP for evaluation of chest pain of uncertain etiology, an ETT was arranged which was overall normal.  She ultimately underwent a coronary CTA revealing a calcium score of 0.    She presents today with concerns of excessive daytime sleepiness and snoring.  She was evaluated by her PCP with recommendations for sleep evaluation however this was ultimately not completed.  She is very physically active, exercises routinely.  She is also involved with her husband's care, he has Alzheimer's but is still high functioning, he has noted to her on several occasions that she is snoring very loudly, she is also excessively sleepy during the day. She denies chest pain, palpitations, dyspnea, pnd, orthopnea, n, v, dizziness, syncope, edema, weight gain, or early satiety.      Studies Reviewed EKG Interpretation Date/Time:  Tuesday May 05 2024 15:37:57 EDT Ventricular Rate:  75 PR Interval:  150 QRS Duration:  64 QT Interval:  386 QTC Calculation: 431 R Axis:   64  Text Interpretation: Normal sinus rhythm When compared with ECG of 29-Aug-2004 08:27, No significant change was found Confirmed by Carlin Nest [phone]) on 05/05/2024 3:43:01 PM    Cardiac Studies & Procedures   ______________________________________________________________________________________________   STRESS TESTS  EXERCISE TOLERANCE TEST (ETT) 02/05/2022         CT SCANS  CT CORONARY MORPH W/CTA COR W/SCORE 02/26/2022  Addendum 02/26/2022  6:05 PM ADDENDUM REPORT: 02/26/2022 18:02  CLINICAL DATA:  52F with chest pain  EXAM: Cardiac/Coronary CTA  TECHNIQUE: The patient was scanned on a [employer].  FINDINGS: A 100 kV prospective scan was triggered in the descending thoracic aorta at 111 HU's. Axial non-contrast 3 mm slices were carried out through the heart. The data set was analyzed on a dedicated work station and scored using the Agatson method. Gantry rotation speed was 250 msecs and collimation was .6 mm. 0.8 mg of sl NTG was given. The 3D data set was reconstructed in 5% intervals of the 35-75 % of the R-R cycle. Phases were analyzed on a dedicated work station using MPR, MIP and VRT modes. The patient received 80 cc of contrast.  Coronary Arteries:  Normal coronary origin.  Right dominance.  RCA is a large dominant artery that gives rise to PDA and PLA. There is no plaque.  Left main is a large artery that gives rise to LAD and LCX arteries.  LAD is a large vessel that has no plaque.  LCX is a non-dominant artery that gives rise to one large OM1 branch. There is no plaque.  Other findings:  Left Ventricle: Normal size  Left Atrium: Mild enlargement  Pulmonary Veins: Normal configuration  Right Ventricle: Normal size  Right Atrium: Normal size  Cardiac valves: No calcifications  Thoracic aorta: Normal size  Pulmonary Arteries: Normal size  Systemic Veins: Normal drainage  Pericardium: Normal thickness  IMPRESSION: 1. Coronary calcium score of 0.  2. Normal coronary origin with right dominance.  3. No evidence of CAD.  CAD-RADS 0. No evidence of CAD (0%). Consider non-atherosclerotic causes of chest pain.   Electronically Signed By: Lonni Nanas M.D. On: 02/26/2022 18:02  Narrative EXAM: OVER-READ INTERPRETATION  CT CHEST  The following report is an over-read  performed by radiologist Dr. Rockey Kilts of [facility] on 02/26/2022. This over-read does not include interpretation of cardiac or coronary anatomy or pathology. The coronary CTA interpretation by the cardiologist is attached.  COMPARISON:  02/04/2022 chest radiograph from [facility].  FINDINGS: Vascular: Aortic atherosclerosis. No central pulmonary embolism, on this non-dedicated study.  Mediastinum/Nodes: No imaged thoracic adenopathy.  Lungs/Pleura: No pleural fluid.  Clear imaged lungs.  Upper Abdomen: Normal imaged portions of the liver, spleen, stomach.  Musculoskeletal: No acute osseous abnormality.  IMPRESSION: No acute findings in the imaged extracardiac chest.  Aortic Atherosclerosis (ICD10-I70.0).  Electronically Signed: By: Rockey Kilts M.D. On: 02/26/2022 16:34     ______________________________________________________________________________________________      Risk Assessment/Calculations       STOP-Bang Score:  3      Physical Exam VS:  BP 120/60   Pulse 75   Ht 5' 4.5 (1.638 m)   Wt 128 lb (58.1 kg)   SpO2 97%   BMI 21.63 kg/m        Wt Readings from Last 3 Encounters:  05/05/24 128 lb (58.1 kg)  02/24/24 129 lb 9.6 oz (58.8 kg)  01/27/24 129 lb (58.5 kg)    GEN: Well nourished and appears younger than stated age, well developed in no acute distress NECK: No JVD; No carotid bruits CARDIAC: RRR, no murmurs, rubs, gallops RESPIRATORY:  Clear to auscultation without rales, wheezing or rhonchi  ABDOMEN: Soft, non-tender, non-distended EXTREMITIES:  No edema; No deformity   ASSESSMENT AND PLAN Atypical chest pain -coronary CTA in 2023 revealed a calcium score of 0.  No further episodes  Excessive daytime sleepiness/snoring-STOP-BANG score of 3, will arrange for an at home Itamar sleep evaluation.  Dyslipidemia-calcium score of 0, she is not inclined to take any lipid-lowering agents.         Dispo: At home sleep  evaluation, follow-up on as-needed basis.  Signed, Delon JAYSON Hoover, NP  "

## 2024-05-05 ENCOUNTER — Telehealth: Payer: Self-pay

## 2024-05-05 ENCOUNTER — Ambulatory Visit: Attending: Cardiology | Admitting: Cardiology

## 2024-05-05 ENCOUNTER — Encounter: Payer: Self-pay | Admitting: *Deleted

## 2024-05-05 ENCOUNTER — Encounter: Payer: Self-pay | Admitting: Cardiology

## 2024-05-05 VITALS — BP 120/60 | HR 75 | Ht 64.5 in | Wt 128.0 lb

## 2024-05-05 DIAGNOSIS — R0789 Other chest pain: Secondary | ICD-10-CM | POA: Diagnosis not present

## 2024-05-05 DIAGNOSIS — R0683 Snoring: Secondary | ICD-10-CM

## 2024-05-05 DIAGNOSIS — G4719 Other hypersomnia: Secondary | ICD-10-CM | POA: Diagnosis not present

## 2024-05-05 NOTE — Addendum Note (Signed)
 Addended by: SHERRE ADE I on: 05/05/2024 04:37 PM   Modules accepted: Orders

## 2024-05-05 NOTE — Patient Instructions (Signed)
 Medication Instructions:  Your physician recommends that you continue on your current medications as directed. Please refer to the Current Medication list given to you today.  *If you need a refill on your cardiac medications before your next appointment, please call your pharmacy*  Lab Work: None If you have labs (blood work) drawn today and your tests are completely normal, you will receive your results only by: MyChart Message (if you have MyChart) OR A paper copy in the mail If you have any lab test that is abnormal or we need to change your treatment, we will call you to review the results.  Testing/Procedures: Itamar Sleep Study  Follow-Up: At [facility], you and your health needs are our priority.  As part of our continuing mission to provide you with exceptional heart care, our providers are all part of one team.  This team includes your primary Cardiologist (physician) and Advanced Practice Providers or APPs (Physician Assistants and Nurse Practitioners) who all work together to provide you with the care you need, when you need it.  Your next appointment:   Follow up as needed   Provider:   Redell Leiter, MD    We recommend signing up for the patient portal called MyChart.  Sign up information is provided on this After Visit Summary.  MyChart is used to connect with patients for Virtual Visits (Telemedicine).  Patients are able to view lab/test results, encounter notes, upcoming appointments, etc.  Non-urgent messages can be sent to your provider as well.   To learn more about what you can do with MyChart, go to [email or web address].   Other Instructions None

## 2024-05-05 NOTE — Telephone Encounter (Signed)
 Patient agreement reviewed and signed on 05/05/2024.  WatchPAT issued to patient on 05/05/2024 by Charlie LILLETTE Free, RN. Patient aware to not open the WatchPAT box until contacted with the activation PIN. Patient profile initialized in CloudPAT on 05/05/2024 by Charlie LILLETTE Free RN. Device serial number: [id number]

## 2024-05-18 ENCOUNTER — Telehealth: Payer: Self-pay | Admitting: Cardiology

## 2024-05-18 NOTE — Telephone Encounter (Signed)
  Patient is calling to see if she is able to begin her at home sleep study. She was told someone would contact her to let her know the PIN number when she was able to start.

## 2024-05-20 ENCOUNTER — Encounter: Payer: Self-pay | Admitting: Cardiology

## 2024-05-20 NOTE — Telephone Encounter (Signed)
Patient called to follow-up on when she should start her sleep study.

## 2024-05-21 NOTE — Telephone Encounter (Signed)
**Note De-Identified Margrette Wynia Obfuscation** I started a Itamar-HST PA though the [facility] provider portal. Outpatient Authorization [id number]

## 2024-05-28 ENCOUNTER — Encounter (INDEPENDENT_AMBULATORY_CARE_PROVIDER_SITE_OTHER): Admitting: Cardiology

## 2024-05-28 DIAGNOSIS — G4719 Other hypersomnia: Secondary | ICD-10-CM

## 2024-05-28 DIAGNOSIS — R0683 Snoring: Secondary | ICD-10-CM | POA: Diagnosis not present

## 2024-05-28 NOTE — Telephone Encounter (Signed)
**Note De-Identified Simrin Vegh Obfuscation** Per letter received from [facility] Albertina Leise fax this PA has been approved from 05/21/2024 until 08/19/2024. Outpatient Authorization #[id number] Ordering provider: Delon Hoover, NP  Associated diagnoses: Snoring-R06.83 and Hypersomnia-G47.19  WatchPAT PA obtained on 05/28/2024 by Zakeya Junker, Avelina HERO, LPN. Authorization: Per letter received from [facility] Remie Mathison fax this PA has been approved from 05/21/2024 until 08/19/2024. Outpatient Authorization [id number]  Patient notified of PIN (1234) on 05/28/2024 Tyvon Eggenberger Notification Method: phone (No ans so I left a message on her VM (Ok per  [employer]) advising her of this approval and of the WatchPAT One-HST Pin # of [id number]. I also sent the pt a MYCHART message advising her of this approval and the Pin # of [id number]. Phone note routed to covering staff for follow-up.

## 2024-06-08 ENCOUNTER — Telehealth: Payer: Self-pay | Admitting: Cardiology

## 2024-06-08 NOTE — Telephone Encounter (Signed)
 Patient called to follow-up on sleep test results.

## 2024-06-09 ENCOUNTER — Ambulatory Visit: Attending: Cardiology

## 2024-06-09 DIAGNOSIS — R0789 Other chest pain: Secondary | ICD-10-CM

## 2024-06-09 DIAGNOSIS — R0683 Snoring: Secondary | ICD-10-CM

## 2024-06-09 DIAGNOSIS — G4719 Other hypersomnia: Secondary | ICD-10-CM

## 2024-06-09 NOTE — Procedures (Signed)
     SLEEP STUDY REPORT Patient Information Study Date: 05/28/2024 Patient Name: Kerry Lewis Patient ID: [id number] Birth Date: 23-Feb-1951 Age: 73 Gender:  BMI: 21.3 (W=128 lb, H=5' 5'') Referring Physician: Delon Hoover, NP  TEST DESCRIPTION: Home sleep apnea testing was completed using the WatchPat, a Type 1 device, utilizing  peripheral arterial tonometry (PAT), chest movement, actigraphy, pulse oximetry, pulse rate, body position and snore.  AHI was calculated with apnea and hypopnea using valid sleep time as the denominator. RDI includes apneas,  hypopneas, and RERAs. The data acquired and the scoring of sleep and all associated events were performed in  accordance with the recommended standards and specifications as outlined in the AASM Manual for the Scoring of  Sleep and Associated Events 2.2.0 (2015).   FINDINGS: 1. No evidence of Obstructive Sleep Apnea with AHI 0.4/hr.  2. No Central Sleep Apnea. 3. Oxygen desaturations as low as 91%. 4. Mild to moderate snoring was present. O2 sats were < 88% for . 5. Total sleep time was 7 hrs and 40 min. 6. 21.1% of total sleep time was spent in REM sleep.  7. Normal sleep onset latency at 19 min.  8. Shortened REM sleep onset latency at 76 min.  9. Total awakenings were 5.   DIAGNOSIS:  Normal study with no significant sleep disordered breathing.  RECOMMENDATIONS: 1. Normal study with no significant sleep disordered breathing. 2. Healthy sleep recommendations include: adequate nightly sleep (normal 7-9 hrs/night), avoidance of caffeine after  noon and alcohol near bedtime, and maintaining a sleep environment that is cool, dark and quiet. 3. Weight loss for overweight patients is recommended.  4. Snoring recommendations include: weight loss where appropriate, side sleeping, and avoidance of alcohol before  bed. 5. Operation of motor vehicle or dangerous equipment must be avoided when feeling drowsy, excessively  sleepy, or  mentally fatigued.  6. An ENT consultation which may be useful for specific causes of and possible treatment of bothersome snoring .  7. Weight loss may be of benefit in reducing the severity of snoring.   Signature: Wilbert Bihari, MD; [facility]; Diplomat, American Board of Sleep  Medicine Electronically Signed: 06/09/2024 10:59:26 AM

## 2024-06-11 ENCOUNTER — Telehealth: Payer: Self-pay | Admitting: *Deleted

## 2024-06-11 DIAGNOSIS — M79674 Pain in right toe(s): Secondary | ICD-10-CM | POA: Diagnosis not present

## 2024-06-11 DIAGNOSIS — L851 Acquired keratosis [keratoderma] palmaris et plantaris: Secondary | ICD-10-CM | POA: Diagnosis not present

## 2024-06-11 NOTE — Telephone Encounter (Signed)
-----   Message from Wilbert Bihari sent at 06/09/2024 11:01 AM EDT ----- Please let patient know that sleep study showed no significant sleep apnea.

## 2024-06-11 NOTE — Telephone Encounter (Signed)
 The patient has been notified of the result and verbalized understanding.  All questions (if any) were answered. Joshua Dalton Seip, CMA 06/11/2024 4:11 PM    Pt is agreeable to normal results.

## 2024-06-15 DIAGNOSIS — Z0001 Encounter for general adult medical examination with abnormal findings: Secondary | ICD-10-CM | POA: Diagnosis not present

## 2024-06-15 DIAGNOSIS — E063 Autoimmune thyroiditis: Secondary | ICD-10-CM | POA: Diagnosis not present

## 2024-06-15 NOTE — Telephone Encounter (Signed)
 The patient has been notified of the result and verbalized understanding.  All questions (if any) were answered. Joshua Dalton Seip, CMA 06/11/2024 4:11 PM    Pt is agreeable to normal results.

## 2024-06-24 DIAGNOSIS — D72818 Other decreased white blood cell count: Secondary | ICD-10-CM | POA: Diagnosis not present

## 2024-07-23 ENCOUNTER — Encounter: Payer: Self-pay | Admitting: Gastroenterology

## 2024-07-23 ENCOUNTER — Ambulatory Visit: Admitting: Gastroenterology

## 2024-07-23 VITALS — BP 120/60 | HR 81 | Ht 64.0 in | Wt 129.2 lb

## 2024-07-23 DIAGNOSIS — K200 Eosinophilic esophagitis: Secondary | ICD-10-CM

## 2024-07-23 DIAGNOSIS — K580 Irritable bowel syndrome with diarrhea: Secondary | ICD-10-CM

## 2024-07-23 DIAGNOSIS — K900 Celiac disease: Secondary | ICD-10-CM | POA: Diagnosis not present

## 2024-07-23 MED ORDER — PANTOPRAZOLE SODIUM 20 MG PO TBEC: 20.0000 mg | DELAYED_RELEASE_TABLET | Freq: Every day | ORAL | 1 refills | Status: AC

## 2024-07-23 MED ORDER — HYOSCYAMINE SULFATE 0.125 MG SL SUBL
0.1250 mg | SUBLINGUAL_TABLET | Freq: Four times a day (QID) | SUBLINGUAL | 6 refills | Status: AC | PRN
Start: 2024-07-23 — End: ?

## 2024-07-23 NOTE — Patient Instructions (Addendum)
 _______________________________________________________  If your blood pressure at your visit was 140/90 or greater, please contact your primary care physician to follow up on this.  _______________________________________________________  If you are age 75 or older, your body mass index should be between 23-30. Your Body mass index is 22.19 kg/m. If this is out of the aforementioned range listed, please consider follow up with your Primary Care Provider.  If you are age 69 or younger, your body mass index should be between 19-25. Your Body mass index is 22.19 kg/m. If this is out of the aformentioned range listed, please consider follow up with your Primary Care Provider.   ________________________________________________________  The [facility] providers would like to encourage you to use MYCHART to communicate with providers for non-urgent requests or questions.  Due to long hold times on the telephone, sending your provider a message by [facility] may be a faster and more efficient way to get a response.  Please allow 48 business hours for a response.  Please remember that this is for non-urgent requests.  _______________________________________________________  [facility] is using a team-based approach to care.  Your team is made up of your doctor and two to three APPS. Our APPS (Nurse Practitioners and Physician Assistants) work with your physician to ensure care continuity for you. They are fully qualified to address your health concerns and develop a treatment plan. They communicate directly with your gastroenterologist to care for you. Seeing the Advanced Practice Practitioners on your physician's team can help you by facilitating care more promptly, often allowing for earlier appointments, access to diagnostic testing, procedures, and other specialty referrals.   We have sent the following medications to your pharmacy for you to pick up at your convenience: Protonix  20mg   daily for 3 months and then every other day for 3 months ([facility]) Levsin  (Prevo)  Continue gluten free and lactose free diet  Thank you,  Dr. Lynnie Bring

## 2024-07-23 NOTE — Progress Notes (Signed)
 Chief Complaint: FU  Referring Provider:  Fleeta Finger, Selinda, MD      ASSESSMENT AND PLAN;   #1. Celiac disease: Dx 2008. Patient has been compliant with gluten-free diet.  #2. EoE: With distal eso stricture s/p eso dil 02/2017 to 52Fr. Seen Dr Maurilio, neg allergy testing but advised gluten and lactose-free diet. Has been treated with steroid inhalers.  #3. IBS-D. Neg colon 06/2023, 06/2018 with neg random Bx.  Plan: -Cut protonix  to 20mg  po every day x 12 weeks, then every other day x 12 weeks, then stop. -Continue hyoscyamine  0.125 mg sublingual 1 to 2 tablets every 6-8 hours as needed #60, 6 refills (PREVO) -Continue lactose/ gluten-free diet.   HPI:    History of Present Illness Kerry Lewis is a 73 year old female with eosinophilic esophagitis and celiac disease who presents with a desire to discontinue Protonix .  She has been on Protonix  for six years for eosinophilic esophagitis and is currently taking 40 mg daily. She follows a dairy-free diet due to eosinophilic esophagitis and a gluten-free diet due to celiac disease. Her last EGD was two years ago.  She experiences fatigue, which she attributes to medication use. Apple cider vinegar was ineffective for her eosinophilic esophagitis, and she had a stricture that required dilation in the past. She is interested in trying baking soda as an alternative.  She has a history of irritable bowel syndrome, which has improved with black seed oil. She occasionally uses hyoscyamine  for symptoms, especially when anticipating a flare-up. Medicare no longer covers her previous medication, eyemycin, and she inquires about alternatives.  She takes magnesium, 200 mg, for sleep and reports improved diarrhea since starting a laxative. She adjusts magnesium intake based on diarrhea symptoms.  She is on multiple medications for hair loss, including hydroxychloroquine 200 mg twice a day since 2019. She questions if her fatigue could be related  to her medications. She also takes finasteride for hair, which she currently takes in the morning.  She reports poor sleep, characterized by waking up during the night. She has tried to discontinue trazodone  but finds it difficult. A sleep test was negative for sleep apnea.  A holistic doctor suggested she might have 'leaky gut' based on blood work, but she has not received results from a stool test yet. She reports stable weight and no significant weight loss.  Did well with Bentyl  but had dryness of the mouth.  Wt Readings from Last 3 Encounters:  07/23/24 129 lb 4 oz (58.6 kg)  05/05/24 128 lb (58.1 kg)  02/24/24 129 lb 9.6 oz (58.8 kg)     Past GI WU:  Colon 06/2023: neg except diverticulosis.  No need to repeat unless problems.  Colon 06/2018 - Mild pancolonic diverticulosis. - Minimal melanosis in the left colon. - Otherwise normal colonoscopy to TI. - Neg TI and colon Bx  Diagnosed with eosinophilic esophagitis on EGD with biopsies on 02/25/2017 with distal esophageal stricture s/p dilatation to 33 [place] with resolution of dysphagia.  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 Small internal hemorrhoids. otherise normal.   PARATHYROIDECTOMY     TONSILLECTOMY     UPPER GI ENDOSCOPY  08/06/2007   Previously dx Celiac  Disease.Risidual pill @ 30cm without any underlying esophageal lesions. S/P biopsies and esophageal dilatation. Esophagus was dilated. Bx: Unremarkable small bowel mucosa with architecturally normal villi, Fungal forms consistent with Candida.   UPPER GI ENDOSCOPY  02/25/2017   Distal esophageal stricture s/p esophagel dilatation. SI Bx: benign small bowel mucosa. Esophagus Bx: Hyperplastic benign squamous mucosa with chronic inflammation and Eosinophila, focally in excess of 40 eosinophils per high power field, consistent with EOE.  Stomach Bx: Fundic glad polyps in a setting of chronic gastritis.    Family History  Problem Relation Age of Onset   Esophageal cancer Father        smoker   Heart disease Sister    Colon cancer Maternal Uncle    Breast cancer Paternal Aunt    Diabetes Paternal Aunt    Stomach cancer Neg Hx    Pancreatic cancer Neg Hx    Colon polyps Neg Hx    Rectal cancer Neg Hx     Social History   Tobacco Use   Smoking status: Never    Passive exposure: Never   Smokeless tobacco: Never  Vaping Use   Vaping status: Never Used  Substance Use Topics   Alcohol use: No    Alcohol/week: 0.0 standard drinks of alcohol   Drug use: No    Current Outpatient Medications  Medication Sig Dispense Refill   betamethasone dipropionate 0.05 % lotion Apply to the vertex scalp every other day as needed.     finasteride (PROSCAR) 5 MG tablet Take 5 mg by mouth daily.     hydroxychloroquine (PLAQUENIL) 200 MG tablet Take 200 mg by mouth 2 (two) times daily.     hyoscyamine  (LEVSIN  SL) 0.125 MG SL tablet Take 1 tablet (0.125 mg total) by mouth every 6 (six) hours as needed. 90 tablet 0   ketoconazole (NIZORAL) 2 % shampoo Apply 1 application. topically 3 (three) times a week.     MAGNESIUM-CALCIUM-FOLIC ACID  PO Take 1 tablet by mouth daily.     minoxidil (LONITEN) 2.5 MG tablet Take by mouth.     Multiple Vitamins-Minerals (ZINC  PO) Take 1 tablet by mouth daily.     Omega 3 340  MG CPDR Take 1 capsule by mouth daily.     pantoprazole  (PROTONIX ) 40 MG tablet TAKE ONE TABLET BY MOUTH once DAILY 90 tablet 3   tacrolimus (PROTOPIC) 0.1 % ointment Apply 1 application. topically 3 (three) times a week.     traZODone  (DESYREL ) 100 MG tablet Take 100 mg by mouth at bedtime.     tretinoin (RETIN-A) 0.1 % cream Apply 1 Application topically at bedtime.     VITAMIN D -VITAMIN K PO Take 3,000 Units by mouth 3 (three) times a week.     Zinc  50 MG TABS Take 50 mg by mouth daily.     No current facility-administered medications for this visit.    Allergies  Allergen Reactions   Dairy Aid [Tilactase]    Gluten Meal Nausea Only    Celiac disease   Milk-Related Compounds  Other Reaction(s): EoE    Review of Systems:  neg     Physical Exam:    BP 120/60 (BP Location: Left Arm, Patient Position: Sitting, Cuff Size: Normal)   Pulse 81   Ht 5' 4 (1.626 m)   Wt 129 lb 4 oz (58.6 kg)   BMI 22.19 kg/m  Filed Weights   07/23/24 1603  Weight: 129 lb 4 oz (58.6 kg)   Constitutional:  Well-developed, in no acute distress. Psychiatric: Normal mood and affect. Behavior is normal. HEENT: Pupils normal.  Conjunctivae are normal. No scleral icterus. Abdominal: Soft, nondistended. Nontender. Bowel sounds active throughout. There are no masses palpable. No hepatomegaly. Rectal:  defered Neurological: Alert and oriented to person place and time. Skin: Skin is warm and dry. No rashes noted.     Anselm Bring, MD 07/23/2024, 4:30 PM  Cc: Fleeta Valeria Mayo, MD

## 2024-07-24 ENCOUNTER — Encounter (HOSPITAL_BASED_OUTPATIENT_CLINIC_OR_DEPARTMENT_OTHER): Payer: Self-pay | Admitting: Emergency Medicine

## 2024-07-24 ENCOUNTER — Ambulatory Visit (HOSPITAL_BASED_OUTPATIENT_CLINIC_OR_DEPARTMENT_OTHER)
Admission: EM | Admit: 2024-07-24 | Discharge: 2024-07-24 | Disposition: A | Discharge status: Home / Self Care | Attending: Family Medicine | Admitting: Family Medicine

## 2024-07-24 DIAGNOSIS — I809 Phlebitis and thrombophlebitis of unspecified site: Secondary | ICD-10-CM

## 2024-07-24 DIAGNOSIS — M79662 Pain in left lower leg: Secondary | ICD-10-CM | POA: Diagnosis not present

## 2024-07-24 MED ORDER — CEPHALEXIN 500 MG PO CAPS: 1000.0000 mg | ORAL_CAPSULE | Freq: Four times a day (QID) | ORAL | 0 refills | Status: AC

## 2024-07-24 NOTE — Discharge Instructions (Addendum)
 Left lower leg phlebitis and pain: May take a 81 mg aspirin, every other day for 5 to 10 days.  Encouraged elevation of left lower leg.  Encouraged cephalexin , 500 mg, 2 pills twice daily for 5 days.  Get plenty of fluids and rest.  Follow-up if symptoms do not improve, worsen or new symptoms occur.  If symptoms persist may need left lower leg venous Doppler to rule out blood clot in the lower leg.  The patient does not have a blood clot/DVT.  But, if any symptoms occur that could indicate a DVT, she should see primary care or return here for additional work-up.  Safety Alerts:  Contact a health care provider if: You miss a dose of your blood thinner. You have unusual bruising or other color changes. You have new or worse pain, swelling, or redness in an arm or a leg. You have worsening numbness or tingling in an arm or a leg. You have a significant color change (pale or blue) in the extremity that has the DVT. Get help right away if: You have signs or symptoms that a blood clot has moved to the lungs. These may include: Shortness of breath. Chest pain. Fast or irregular heartbeats (palpitations). Light-headedness, dizziness, or fainting. Coughing up blood. You have signs or symptoms that your blood is too thin. These may include: Blood in your vomit, stool, or urine. A cut that will not stop bleeding. A menstrual period that is heavier than usual. A severe headache or confusion. These symptoms may be an emergency. Get help right away. Call 911. Do not wait to see if the symptoms will go away. Do not drive yourself to the hospital.

## 2024-07-24 NOTE — Medical Student Note (Signed)
 [facility] URGENT CARE Provider Student Note For educational purposes for Medical, PA and NP students only and not part of the legal medical record.   CSN: [id number] Arrival date & time: 07/24/24  1819      History   Chief Complaint Chief Complaint  Patient presents with   Leg Pain    HPI Kerry Lewis is a 73 y.o. female.  Pt with a hx of HLD, hypothyroidism, and celiac's disease presents with a throbbing to the L anterior lower leg that started this afternoon. She noticed a prominent superficial vein appear in the same area. She denies any hx of CA, blood clots, estrogen therapy, or long periods of travel or sedentary time. She was on her feet a lot today.  The history is provided by the patient.  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   Osteopenia 12/28/2013   Status post dilation of esophageal narrowing    Vitamin D  deficiency     Patient Active Problem List   Diagnosis Date Noted   Snoring 02/24/2024   Daytime somnolence 02/24/2024   Muscle spasm 01/27/2024   Localized osteoporosis without current pathological fracture 01/06/2024   Insomnia 11/18/2023   Seborrheic dermatitis 10/15/2023   Well woman exam with routine gynecological exam 09/16/2023   Thyroid  dysfunction 10/17/2022   Hypercholesterolemia 10/17/2022   Vitamin D  deficiency 10/17/2022   Vitamin B12 deficiency 10/17/2022   BMI 21.0-21.9, adult 10/17/2022   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   Rhytides 09/10/2022   Capsulitis of metatarsophalangeal (MTP) joint of left foot  07/12/2022   Celiac disease 03/28/2022   Status post dilation of esophageal narrowing 02/05/2022   IBS (irritable bowel syndrome) 02/05/2022   History of basal cell carcinoma 02/05/2022   GERD (gastroesophageal reflux disease) 02/05/2022   Eosinophilic esophagitis 02/05/2022   Cancer (HCC) 02/05/2022   Anxiety and depression 02/05/2022   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 Small internal hemorrhoids. otherise normal.   PARATHYROIDECTOMY     TONSILLECTOMY     UPPER GI ENDOSCOPY  08/06/2007   Previously dx Celiac Disease.Risidual pill @ 30cm without any underlying esophageal lesions. S/P biopsies and esophageal dilatation. Esophagus was dilated. Bx: Unremarkable small bowel mucosa with architecturally normal villi, Fungal forms consistent with Candida.   UPPER GI ENDOSCOPY  02/25/2017   Distal esophageal stricture s/p esophagel dilatation. SI Bx: benign small bowel mucosa. Esophagus Bx: Hyperplastic benign squamous mucosa with chronic inflammation and Eosinophila, focally in excess of 40 eosinophils per high power field, consistent with EOE.  Stomach Bx: Fundic glad polyps in a setting of chronic gastritis.    OB History     Gravida  3   Para  2   Term      Preterm      AB  1   Living  2      SAB  1   IAB      Ectopic      Multiple      Live Births               Home Medications    Prior to Admission medications   Medication Sig Start Date End Date Taking? Authorizing Provider   hydroxychloroquine (PLAQUENIL) 200 MG tablet Take 200 mg by mouth 2 (two) times daily. 08/07/21  Yes [provider]  minoxidil (LONITEN) 2.5 MG tablet Take by mouth. 09/10/22  Yes [provider]  pantoprazole  (PROTONIX ) 20 MG tablet Take 1 tablet (20 mg total) by mouth daily. daily for 3 months and then every other day for 3 months 07/23/24  Yes Charlanne Groom, MD  pantoprazole  (PROTONIX ) 40 MG tablet TAKE ONE TABLET BY MOUTH once DAILY 11/19/23  Yes Charlanne Groom, MD  traZODone  (DESYREL ) 100 MG tablet Take 100 mg by mouth at bedtime.   Yes [provider]  tretinoin (RETIN-A) 0.1 % cream Apply 1 Application topically at bedtime. 04/10/24  Yes [provider]  betamethasone dipropionate 0.05 % lotion Apply to the vertex scalp every other day as needed. 10/14/23   [provider]  finasteride (PROSCAR) 5 MG tablet Take 5 mg by mouth daily. 05/04/24   [provider]  hyoscyamine  (LEVSIN  SL) 0.125 MG SL tablet Take 1 tablet (0.125 mg total) by mouth every 6 (six) hours as needed. 11/18/23   Fleeta Valeria Mayo, MD  hyoscyamine  (LEVSIN  SL) 0.125 MG SL tablet Place 1-2 tablets (0.125-0.25 mg total) under the tongue every 6 (six) hours as needed. 07/23/24   Charlanne Groom, MD  ketoconazole (NIZORAL) 2 % shampoo Apply 1 application. topically 3 (three) times a week. 12/19/20   [provider]  MAGNESIUM-CALCIUM-FOLIC ACID  PO Take 1 tablet by mouth daily.    [provider]  Multiple Vitamins-Minerals (ZINC  PO) Take 1 tablet by mouth daily.    [provider]  Omega 3 340 MG CPDR Take 1 capsule by mouth daily.    [provider]  tacrolimus (PROTOPIC) 0.1 % ointment Apply 1 application. topically 3 (three) times a week. 12/19/20   [provider]  VITAMIN D -VITAMIN K PO Take 3,000 Units by mouth 3 (three) times a week.    [provider]  Zinc  50 MG TABS Take 50 mg by mouth daily.    [provider]     Family History Family History  Problem Relation Age of Onset   Esophageal cancer Father        smoker   Heart disease Sister    Colon cancer Maternal Uncle    Breast cancer Paternal Aunt    Diabetes Paternal Aunt    Stomach cancer Neg Hx    Pancreatic cancer Neg Hx    Colon polyps Neg Hx    Rectal cancer Neg Hx     Social History Social History   Tobacco Use   Smoking status: Never    Passive exposure: Never   Smokeless tobacco: Never  Vaping Use   Vaping status: Never Used  Substance Use Topics   Alcohol use: No    Alcohol/week: 0.0  standard drinks of alcohol   Drug use: No     Allergies   Dairy aid [tilactase], Gluten meal, and Milk-related compounds   Review of Systems Review of Systems  Respiratory:  Negative for shortness of breath.   Cardiovascular:  Negative for chest pain.  All other systems reviewed and are negative.    Physical Exam Updated Vital Signs BP 135/76 (BP Location: Right Arm)   Pulse 70   Temp 98.1 F (36.7 C) (Oral)   Resp 18   SpO2 98%   Physical Exam Constitutional:      Appearance: Normal appearance.  Cardiovascular:     Pulses:          Dorsalis pedis pulses are 2+ on the right side and 2+ on the left side.  Musculoskeletal:       Legs:     Comments: R leg measurements: above the knee: 38 cm  below the knee: 31 cm above ankle: 22.5  L leg measurements: above the knee 39.5 cm below the knee: 32 cm above the ankle: 23 cm  Neurological:     Mental Status: She is alert.      ED Treatments / Results  Labs (all labs ordered are listed, but only abnormal results are displayed) Labs Reviewed - No data to display  EKG  Radiology No results found.  Procedures Procedures (including critical care time)  Medications Ordered in ED Medications - No data to display   Initial Impression / Assessment and Plan / ED Course  I have reviewed the triage vital signs and the nursing notes.  Pertinent labs & imaging  results that were available during my care of the patient were reviewed by me and considered in my medical decision making (see chart for details).     Superficial Phlebitis  Symptoms likely for superficial phlebitis on the L anterior leg given the lack of risk factors. Well's Score for DVT is a 1 which is a moderate risk factor with a prevalence of 17% for DVT. Discussed with patient about the use of US  to diagnose DVT and that this service is not available. Will treat for superficial phlebitis with cephalexin  1000 mg 4 times daily for 5 days. If symptoms worsen or do not resolved pt instructed to return to UC or follow up with PCP to order and US  evaluation to r/o DVT. Pt is agreeable with this plan. Red flag symptoms reviewed and return precautions given.    Final Clinical Impressions(s) / ED Diagnoses   Final diagnoses:  Pain of left lower leg    New Prescriptions New Prescriptions   No medications on file

## 2024-07-24 NOTE — ED Provider Notes (Signed)
 [facility]    CSN: [id number] Arrival date & time: 07/24/24  1819      History   Chief Complaint Chief Complaint  Patient presents with   Leg Pain    HPI Kerry Lewis is a 73 y.o. female.   73 year old female who reports acute onset throbbing pain in her left lower leg.  The lower leg is warm to touch and tender to touch.  The pain is the anterior lower leg not the calf.  She denies any injury.  She was on her feet a lot today.  Her husband has Alzheimer's and she is often his caregiver.   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   Osteopenia 12/28/2013   Status post dilation of esophageal narrowing    Vitamin D  deficiency     Patient Active Problem List   Diagnosis Date Noted   Snoring 02/24/2024   Daytime somnolence 02/24/2024   Muscle spasm 01/27/2024   Localized osteoporosis without current pathological fracture 01/06/2024   Insomnia 11/18/2023   Seborrheic dermatitis 10/15/2023   Well woman exam with routine gynecological exam 09/16/2023   Thyroid  dysfunction 10/17/2022   Hypercholesterolemia 10/17/2022   Vitamin D  deficiency 10/17/2022   Vitamin B12 deficiency 10/17/2022   BMI 21.0-21.9, adult 10/17/2022   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   Rhytides 09/10/2022   Capsulitis of metatarsophalangeal (MTP) joint of left foot 07/12/2022   Celiac disease 03/28/2022   Status post dilation of esophageal narrowing 02/05/2022   IBS (irritable bowel syndrome) 02/05/2022   History of  basal cell carcinoma 02/05/2022   GERD (gastroesophageal reflux disease) 02/05/2022   Eosinophilic esophagitis 02/05/2022   Cancer (HCC) 02/05/2022   Anxiety and depression 02/05/2022   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 Small internal hemorrhoids. otherise normal.   PARATHYROIDECTOMY     TONSILLECTOMY     UPPER GI ENDOSCOPY  08/06/2007   Previously dx Celiac Disease.Risidual pill @ 30cm without any underlying esophageal lesions. S/P biopsies and esophageal dilatation. Esophagus was dilated. Bx: Unremarkable small bowel mucosa with architecturally normal villi, Fungal forms consistent with Candida.   UPPER GI ENDOSCOPY  02/25/2017   Distal esophageal stricture s/p esophagel dilatation. SI Bx: benign small bowel mucosa. Esophagus Bx: Hyperplastic benign squamous mucosa with chronic inflammation and Eosinophila, focally in excess of 40 eosinophils per high power field, consistent with EOE.  Stomach Bx: Fundic glad polyps in a setting of chronic gastritis.    OB History  Gravida  3   Para  2   Term      Preterm      AB  1   Living  2      SAB  1   IAB      Ectopic      Multiple      Live Births               Home Medications    Prior to Admission medications   Medication Sig Start Date End Date Taking? Authorizing Provider  cephALEXin  (KEFLEX ) 500 MG capsule Take 2 capsules (1,000 mg total) by mouth 4 (four) times daily for 5 days. 07/24/24 07/29/24 Yes Ival Domino, FNP   hydroxychloroquine (PLAQUENIL) 200 MG tablet Take 200 mg by mouth 2 (two) times daily. 08/07/21  Yes [provider]  minoxidil (LONITEN) 2.5 MG tablet Take by mouth. 09/10/22  Yes [provider]  pantoprazole  (PROTONIX ) 20 MG tablet Take 1 tablet (20 mg total) by mouth daily. daily for 3 months and then every other day for 3 months 07/23/24  Yes Charlanne Groom, MD  pantoprazole  (PROTONIX ) 40 MG tablet TAKE ONE TABLET BY MOUTH once DAILY 11/19/23  Yes Charlanne Groom, MD  traZODone  (DESYREL ) 100 MG tablet Take 100 mg by mouth at bedtime.   Yes [provider]  tretinoin (RETIN-A) 0.1 % cream Apply 1 Application topically at bedtime. 04/10/24  Yes [provider]  betamethasone dipropionate 0.05 % lotion Apply to the vertex scalp every other day as needed. 10/14/23   [provider]  finasteride (PROSCAR) 5 MG tablet Take 5 mg by mouth daily. 05/04/24   [provider]  hyoscyamine  (LEVSIN  SL) 0.125 MG SL tablet Take 1 tablet (0.125 mg total) by mouth every 6 (six) hours as needed. 11/18/23   Fleeta Valeria Mayo, MD  hyoscyamine  (LEVSIN  SL) 0.125 MG SL tablet Place 1-2 tablets (0.125-0.25 mg total) under the tongue every 6 (six) hours as needed. 07/23/24   Charlanne Groom, MD  ketoconazole (NIZORAL) 2 % shampoo Apply 1 application. topically 3 (three) times a week. 12/19/20   [provider]  MAGNESIUM-CALCIUM-FOLIC ACID  PO Take 1 tablet by mouth daily.    [provider]  Multiple Vitamins-Minerals (ZINC  PO) Take 1 tablet by mouth daily.    [provider]  Omega 3 340 MG CPDR Take 1 capsule by mouth daily.    [provider]  tacrolimus (PROTOPIC) 0.1 % ointment Apply 1 application. topically 3 (three) times a week. 12/19/20   [provider]  VITAMIN D -VITAMIN K PO Take 3,000 Units by mouth 3 (three) times a week.    [provider]  Zinc  50 MG TABS Take 50 mg by mouth daily.    [provider]     Family History Family History  Problem Relation Age of Onset   Esophageal cancer Father        smoker   Heart disease Sister    Colon cancer Maternal Uncle    Breast cancer Paternal Aunt    Diabetes Paternal Aunt    Stomach cancer Neg Hx    Pancreatic cancer Neg Hx    Colon polyps Neg Hx    Rectal cancer Neg Hx     Social History Social History   Tobacco Use   Smoking status: Never    Passive exposure: Never   Smokeless tobacco: Never  Vaping Use   Vaping status: Never Used  Substance Use Topics   Alcohol  use: No    Alcohol/week: 0.0 standard drinks of alcohol   Drug use: No     Allergies   Dairy aid [tilactase], Gluten meal, and Milk-related compounds   Review of Systems Review of Systems  Constitutional:  Negative for fever.  Respiratory:  Negative for cough.   Cardiovascular:  Negative for chest pain.  Gastrointestinal:  Negative for abdominal pain, constipation, diarrhea, nausea and vomiting.  Musculoskeletal:  Positive for arthralgias (Left lower leg anterior warmth and tenderness and pain.). Negative for back pain.  Skin:  Negative for color change and rash.  Neurological:  Negative for syncope.  All other systems reviewed and are negative.    Physical Exam Triage Vital Signs ED Triage Vitals  Encounter Vitals Group     BP 07/24/24 1920 135/76     Girls Systolic BP Percentile --      Girls Diastolic BP Percentile --      Boys Systolic BP Percentile --      Boys Diastolic BP Percentile --      Pulse Rate 07/24/24 1920 70     Resp 07/24/24 1920 18     Temp 07/24/24 1920 98.1 F (36.7 C)     Temp Source 07/24/24 1920 Oral     SpO2 07/24/24 1920 98 %     Weight --      Height --      Head Circumference --      Peak Flow --      Pain Score 07/24/24 1919 2     Pain Loc --      Pain Education --      Exclude from Growth Chart --    No data found.  Updated Vital Signs BP 135/76 (BP Location: Right Arm)   Pulse 70   Temp 98.1 F (36.7  C) (Oral)   Resp 18   SpO2 98%   Visual Acuity Right Eye Distance:   Left Eye Distance:   Bilateral Distance:    Right Eye Near:   Left Eye Near:    Bilateral Near:     Physical Exam Vitals and nursing note reviewed.  Constitutional:      General: She is not in acute distress.    Appearance: She is well-developed. She is not ill-appearing or toxic-appearing.  HENT:     Head: Normocephalic and atraumatic.     Right Ear: External ear normal.     Left Ear: External ear normal.     Nose: Nose normal.     Mouth/Throat:     Lips: Pink.     Mouth: Mucous membranes are moist.  Eyes:     Conjunctiva/sclera: Conjunctivae normal.     Pupils: Pupils are equal, round, and reactive to light.  Cardiovascular:     Rate and Rhythm: Normal rate and regular rhythm.     Heart sounds: S1 normal and S2 normal. No murmur heard. Pulmonary:     Effort: Pulmonary effort is normal. No respiratory distress.     Breath sounds: Normal breath sounds. No decreased breath sounds, wheezing, rhonchi or rales.  Musculoskeletal:        General: No swelling.     Right hip: Normal.     Left hip: Normal.     Right upper leg: Normal.     Left upper leg: Normal.     Right knee: Normal.     Left knee: Normal.     Right lower leg: Normal.     Left lower leg: Swelling (  Minimal swelling) and tenderness (Soft tissue tenderness on the shin with warmth from above the ankle to below the knee.  There is 1 superficial vein that is prominent and tender.) present. No deformity, lacerations or bony tenderness. No edema.     Right ankle: Normal.     Left ankle: Normal.     Comments: Lower leg measurements: Right leg above the knee: 38 cm, right leg below the knee: 31 cm, right leg above the ankle: 22.5 cm.  Left leg above the knee: 39.5 cm, left leg below the knee: 32 cm, left leg above the ankle: 23 cm  Skin:    General: Skin is warm and dry.     Capillary Refill: Capillary refill takes less than 2 seconds.      Findings: No rash.  Neurological:     Mental Status: She is alert and oriented to person, place, and time.  Psychiatric:        Mood and Affect: Mood normal.      UC Treatments / Results  Labs (all labs ordered are listed, but only abnormal results are displayed) Labs Reviewed - No data to display  EKG   Radiology No results found.  Procedures Procedures (including critical care time)  Medications Ordered in UC Medications - No data to display  Initial Impression / Assessment and Plan / UC Course  I have reviewed the triage vital signs and the nursing notes.  Pertinent labs & imaging results that were available during my care of the patient were reviewed by me and considered in my medical decision making (see chart for details).  Plan of Care: Superficial phlebitis of left lower leg with leg pain: Encouraged aspirin, 81 mg every other day for 5 to 10 days.  Encouraged elevation of the left lower leg.  Cephalexin  500 mg, 2 pills twice daily for 5 days.  Get plenty of fluids and rest.  If symptoms do not improve, worsen or new symptoms occur.  Needs to see primary care or return here.  She may need lower leg ultrasound to rule out DVT.  DVT has not been diagnosed today but I did provide information about DVT including signs and symptoms of worsening condition and reasons to go to an emergency room  I reviewed the plan of care with the patient and/or the patient's guardian.  The patient and/or guardian had time to ask questions and acknowledged that the questions were answered.  I provided instruction on symptoms or reasons to return here or to go to an ER, if symptoms/condition did not improve, worsened or if new symptoms occurred.  Final Clinical Impressions(s) / UC Diagnoses   Final diagnoses:  Pain of left lower leg  Phlebitis     Discharge Instructions      Left lower leg phlebitis and pain: May take a 81 mg aspirin, every other day for 5 to 10 days.  Encouraged  elevation of left lower leg.  Encouraged cephalexin , 500 mg, 2 pills twice daily for 5 days.  Get plenty of fluids and rest.  Follow-up if symptoms do not improve, worsen or new symptoms occur.  If symptoms persist may need left lower leg venous Doppler to rule out blood clot in the lower leg.  The patient does not have a blood clot/DVT.  But, if any symptoms occur that could indicate a DVT, she should see primary care or return here for additional work-up.  Safety Alerts:  Contact a health care provider if: You miss a dose  of your blood thinner. You have unusual bruising or other color changes. You have new or worse pain, swelling, or redness in an arm or a leg. You have worsening numbness or tingling in an arm or a leg. You have a significant color change (pale or blue) in the extremity that has the DVT. Get help right away if: You have signs or symptoms that a blood clot has moved to the lungs. These may include: Shortness of breath. Chest pain. Fast or irregular heartbeats (palpitations). Light-headedness, dizziness, or fainting. Coughing up blood. You have signs or symptoms that your blood is too thin. These may include: Blood in your vomit, stool, or urine. A cut that will not stop bleeding. A menstrual period that is heavier than usual. A severe headache or confusion. These symptoms may be an emergency. Get help right away. Call 911. Do not wait to see if the symptoms will go away. Do not drive yourself to the hospital.     ED Prescriptions     Medication Sig Dispense Auth. Provider   cephALEXin  (KEFLEX ) 500 MG capsule Take 2 capsules (1,000 mg total) by mouth 4 (four) times daily for 5 days. 40 capsule Ival Domino, FNP      PDMP not reviewed this encounter.   Ival Domino, FNP 07/24/24 2040

## 2024-07-24 NOTE — ED Triage Notes (Signed)
 Pt reports this evening she had throbbing pain to her left lower leg it hurts to touch it.

## 2024-07-30 DIAGNOSIS — D485 Neoplasm of uncertain behavior of skin: Secondary | ICD-10-CM | POA: Diagnosis not present

## 2024-07-30 DIAGNOSIS — L280 Lichen simplex chronicus: Secondary | ICD-10-CM | POA: Diagnosis not present

## 2024-07-30 DIAGNOSIS — L570 Actinic keratosis: Secondary | ICD-10-CM | POA: Diagnosis not present

## 2024-07-30 DIAGNOSIS — L281 Prurigo nodularis: Secondary | ICD-10-CM | POA: Diagnosis not present

## 2024-08-11 ENCOUNTER — Other Ambulatory Visit (HOSPITAL_BASED_OUTPATIENT_CLINIC_OR_DEPARTMENT_OTHER): Payer: Self-pay | Admitting: Internal Medicine

## 2024-08-11 DIAGNOSIS — Z1231 Encounter for screening mammogram for malignant neoplasm of breast: Secondary | ICD-10-CM

## 2024-09-01 ENCOUNTER — Ambulatory Visit: Admitting: Internal Medicine

## 2024-09-01 DIAGNOSIS — Z23 Encounter for immunization: Secondary | ICD-10-CM | POA: Diagnosis not present

## 2024-09-01 NOTE — Progress Notes (Signed)
 Nurse Visit Flu Shot, Left Arm

## 2024-09-07 ENCOUNTER — Ambulatory Visit (HOSPITAL_BASED_OUTPATIENT_CLINIC_OR_DEPARTMENT_OTHER)
Admission: RE | Admit: 2024-09-07 | Discharge: 2024-09-07 | Disposition: A | Source: Ambulatory Visit | Attending: Internal Medicine | Admitting: Internal Medicine

## 2024-09-07 DIAGNOSIS — Z1231 Encounter for screening mammogram for malignant neoplasm of breast: Secondary | ICD-10-CM | POA: Diagnosis not present

## 2024-09-28 ENCOUNTER — Other Ambulatory Visit: Payer: Self-pay

## 2024-09-28 MED ORDER — TRAZODONE HCL 100 MG PO TABS
100.0000 mg | ORAL_TABLET | Freq: Every day | ORAL | 3 refills | Status: AC
Start: 2024-09-28 — End: ?
